# Patient Record
Sex: Male | Born: 1970 | Race: White | Hispanic: No | Marital: Married | Smoking: Never smoker
Health system: Southern US, Community
[De-identification: ages and names within clinical notes are randomized; demographics above are authoritative.]

## PROBLEM LIST (undated history)

## (undated) DIAGNOSIS — G709 Myoneural disorder, unspecified: Secondary | ICD-10-CM

## (undated) DIAGNOSIS — M1990 Unspecified osteoarthritis, unspecified site: Secondary | ICD-10-CM

## (undated) HISTORY — PX: SPINE SURGERY: SHX786

## (undated) HISTORY — PX: JOINT REPLACEMENT: SHX530

## (undated) HISTORY — DX: Myoneural disorder, unspecified: G70.9

## (undated) HISTORY — DX: Unspecified osteoarthritis, unspecified site: M19.90

---

## 2001-09-13 ENCOUNTER — Encounter: Admission: RE | Admit: 2001-09-13 | Discharge: 2001-09-13 | Payer: Self-pay | Admitting: *Deleted

## 2001-12-25 ENCOUNTER — Encounter: Payer: Self-pay | Admitting: Neurosurgery

## 2001-12-29 ENCOUNTER — Inpatient Hospital Stay (HOSPITAL_COMMUNITY): Admission: RE | Admit: 2001-12-29 | Discharge: 2001-12-30 | Payer: Self-pay | Admitting: Neurosurgery

## 2001-12-29 ENCOUNTER — Encounter: Payer: Self-pay | Admitting: Neurosurgery

## 2002-07-05 ENCOUNTER — Encounter: Admission: RE | Admit: 2002-07-05 | Discharge: 2002-07-05 | Payer: Self-pay | Admitting: Neurosurgery

## 2002-07-05 ENCOUNTER — Encounter: Payer: Self-pay | Admitting: Neurosurgery

## 2002-07-23 ENCOUNTER — Encounter: Payer: Self-pay | Admitting: Emergency Medicine

## 2002-07-23 ENCOUNTER — Emergency Department (HOSPITAL_COMMUNITY): Admission: EM | Admit: 2002-07-23 | Discharge: 2002-07-24 | Payer: Self-pay | Admitting: Emergency Medicine

## 2002-07-25 ENCOUNTER — Emergency Department (HOSPITAL_COMMUNITY): Admission: EM | Admit: 2002-07-25 | Discharge: 2002-07-25 | Payer: Self-pay | Admitting: Emergency Medicine

## 2002-08-02 ENCOUNTER — Other Ambulatory Visit: Admission: RE | Admit: 2002-08-02 | Discharge: 2002-08-02 | Payer: Self-pay | Admitting: *Deleted

## 2002-08-13 ENCOUNTER — Ambulatory Visit (HOSPITAL_COMMUNITY): Admission: RE | Admit: 2002-08-13 | Discharge: 2002-08-13 | Payer: Self-pay | Admitting: *Deleted

## 2002-08-18 ENCOUNTER — Ambulatory Visit (HOSPITAL_COMMUNITY): Admission: RE | Admit: 2002-08-18 | Discharge: 2002-08-18 | Payer: Self-pay | Admitting: *Deleted

## 2002-08-18 ENCOUNTER — Encounter (INDEPENDENT_AMBULATORY_CARE_PROVIDER_SITE_OTHER): Payer: Self-pay | Admitting: Specialist

## 2002-08-24 ENCOUNTER — Ambulatory Visit (HOSPITAL_COMMUNITY): Admission: RE | Admit: 2002-08-24 | Discharge: 2002-08-24 | Payer: Self-pay | Admitting: *Deleted

## 2002-10-20 ENCOUNTER — Encounter: Admission: RE | Admit: 2002-10-20 | Discharge: 2002-10-20 | Payer: Self-pay | Admitting: *Deleted

## 2002-10-20 ENCOUNTER — Encounter: Payer: Self-pay | Admitting: *Deleted

## 2003-08-25 ENCOUNTER — Encounter: Payer: Self-pay | Admitting: Internal Medicine

## 2003-11-01 ENCOUNTER — Encounter
Admission: RE | Admit: 2003-11-01 | Discharge: 2004-01-30 | Payer: Self-pay | Admitting: Physical Medicine & Rehabilitation

## 2003-12-06 ENCOUNTER — Encounter
Admission: RE | Admit: 2003-12-06 | Discharge: 2004-03-05 | Payer: Self-pay | Admitting: Physical Medicine & Rehabilitation

## 2004-04-19 ENCOUNTER — Encounter
Admission: RE | Admit: 2004-04-19 | Discharge: 2004-06-28 | Payer: Self-pay | Admitting: Physical Medicine & Rehabilitation

## 2004-04-24 ENCOUNTER — Ambulatory Visit: Payer: Self-pay | Admitting: Physical Medicine & Rehabilitation

## 2004-06-28 ENCOUNTER — Encounter
Admission: RE | Admit: 2004-06-28 | Discharge: 2004-08-24 | Payer: Self-pay | Admitting: Physical Medicine & Rehabilitation

## 2004-07-03 ENCOUNTER — Ambulatory Visit: Payer: Self-pay | Admitting: Physical Medicine & Rehabilitation

## 2004-08-24 ENCOUNTER — Encounter
Admission: RE | Admit: 2004-08-24 | Discharge: 2004-11-22 | Payer: Self-pay | Admitting: Physical Medicine & Rehabilitation

## 2004-08-28 ENCOUNTER — Ambulatory Visit: Payer: Self-pay | Admitting: Physical Medicine & Rehabilitation

## 2004-11-13 ENCOUNTER — Ambulatory Visit: Payer: Self-pay | Admitting: Physical Medicine & Rehabilitation

## 2005-02-06 ENCOUNTER — Encounter
Admission: RE | Admit: 2005-02-06 | Discharge: 2005-05-07 | Payer: Self-pay | Admitting: Physical Medicine & Rehabilitation

## 2005-02-06 ENCOUNTER — Ambulatory Visit: Payer: Self-pay | Admitting: Physical Medicine & Rehabilitation

## 2005-04-04 ENCOUNTER — Ambulatory Visit: Payer: Self-pay | Admitting: Physical Medicine & Rehabilitation

## 2005-05-27 ENCOUNTER — Encounter
Admission: RE | Admit: 2005-05-27 | Discharge: 2005-08-25 | Payer: Self-pay | Admitting: Physical Medicine & Rehabilitation

## 2005-05-28 ENCOUNTER — Ambulatory Visit: Payer: Self-pay | Admitting: Physical Medicine & Rehabilitation

## 2005-06-18 ENCOUNTER — Ambulatory Visit: Payer: Self-pay | Admitting: Physical Medicine & Rehabilitation

## 2005-08-08 ENCOUNTER — Ambulatory Visit: Payer: Self-pay | Admitting: Physical Medicine & Rehabilitation

## 2005-08-28 ENCOUNTER — Ambulatory Visit: Payer: Self-pay | Admitting: Physical Medicine & Rehabilitation

## 2005-08-28 ENCOUNTER — Encounter
Admission: RE | Admit: 2005-08-28 | Discharge: 2005-11-26 | Payer: Self-pay | Admitting: Physical Medicine & Rehabilitation

## 2005-10-17 ENCOUNTER — Ambulatory Visit: Payer: Self-pay | Admitting: Physical Medicine & Rehabilitation

## 2005-12-30 ENCOUNTER — Encounter: Admission: RE | Admit: 2005-12-30 | Discharge: 2006-01-17 | Payer: Self-pay | Admitting: Family Medicine

## 2006-01-01 ENCOUNTER — Emergency Department (HOSPITAL_COMMUNITY): Admission: EM | Admit: 2006-01-01 | Discharge: 2006-01-02 | Payer: Self-pay | Admitting: Emergency Medicine

## 2006-02-26 ENCOUNTER — Encounter: Admission: RE | Admit: 2006-02-26 | Discharge: 2006-03-18 | Payer: Self-pay | Admitting: Orthopedic Surgery

## 2006-03-19 ENCOUNTER — Encounter: Admission: RE | Admit: 2006-03-19 | Discharge: 2006-04-03 | Payer: Self-pay | Admitting: Orthopedic Surgery

## 2006-03-25 ENCOUNTER — Encounter: Admission: RE | Admit: 2006-03-25 | Discharge: 2006-04-03 | Payer: Self-pay | Admitting: Orthopedic Surgery

## 2006-04-08 ENCOUNTER — Ambulatory Visit (HOSPITAL_COMMUNITY): Admission: RE | Admit: 2006-04-08 | Discharge: 2006-04-08 | Payer: Self-pay | Admitting: Orthopedic Surgery

## 2006-04-14 ENCOUNTER — Ambulatory Visit (HOSPITAL_COMMUNITY): Admission: RE | Admit: 2006-04-14 | Discharge: 2006-04-16 | Payer: Self-pay | Admitting: Orthopedic Surgery

## 2006-05-07 ENCOUNTER — Encounter: Admission: RE | Admit: 2006-05-07 | Discharge: 2006-06-30 | Payer: Self-pay | Admitting: Orthopedic Surgery

## 2006-07-02 ENCOUNTER — Encounter: Admission: RE | Admit: 2006-07-02 | Discharge: 2006-09-23 | Payer: Self-pay | Admitting: Family Medicine

## 2007-12-21 IMAGING — CR DG CHEST 2V
2 series · 2 of 2 positions shown · non-contrast
Comparison: Report of prior chest x-ray 07/23/02.

CLINICAL DATA: Pre-op for patellar instability.  
 CHEST ? 2 VIEW:

[view not recorded (1 of 2)]
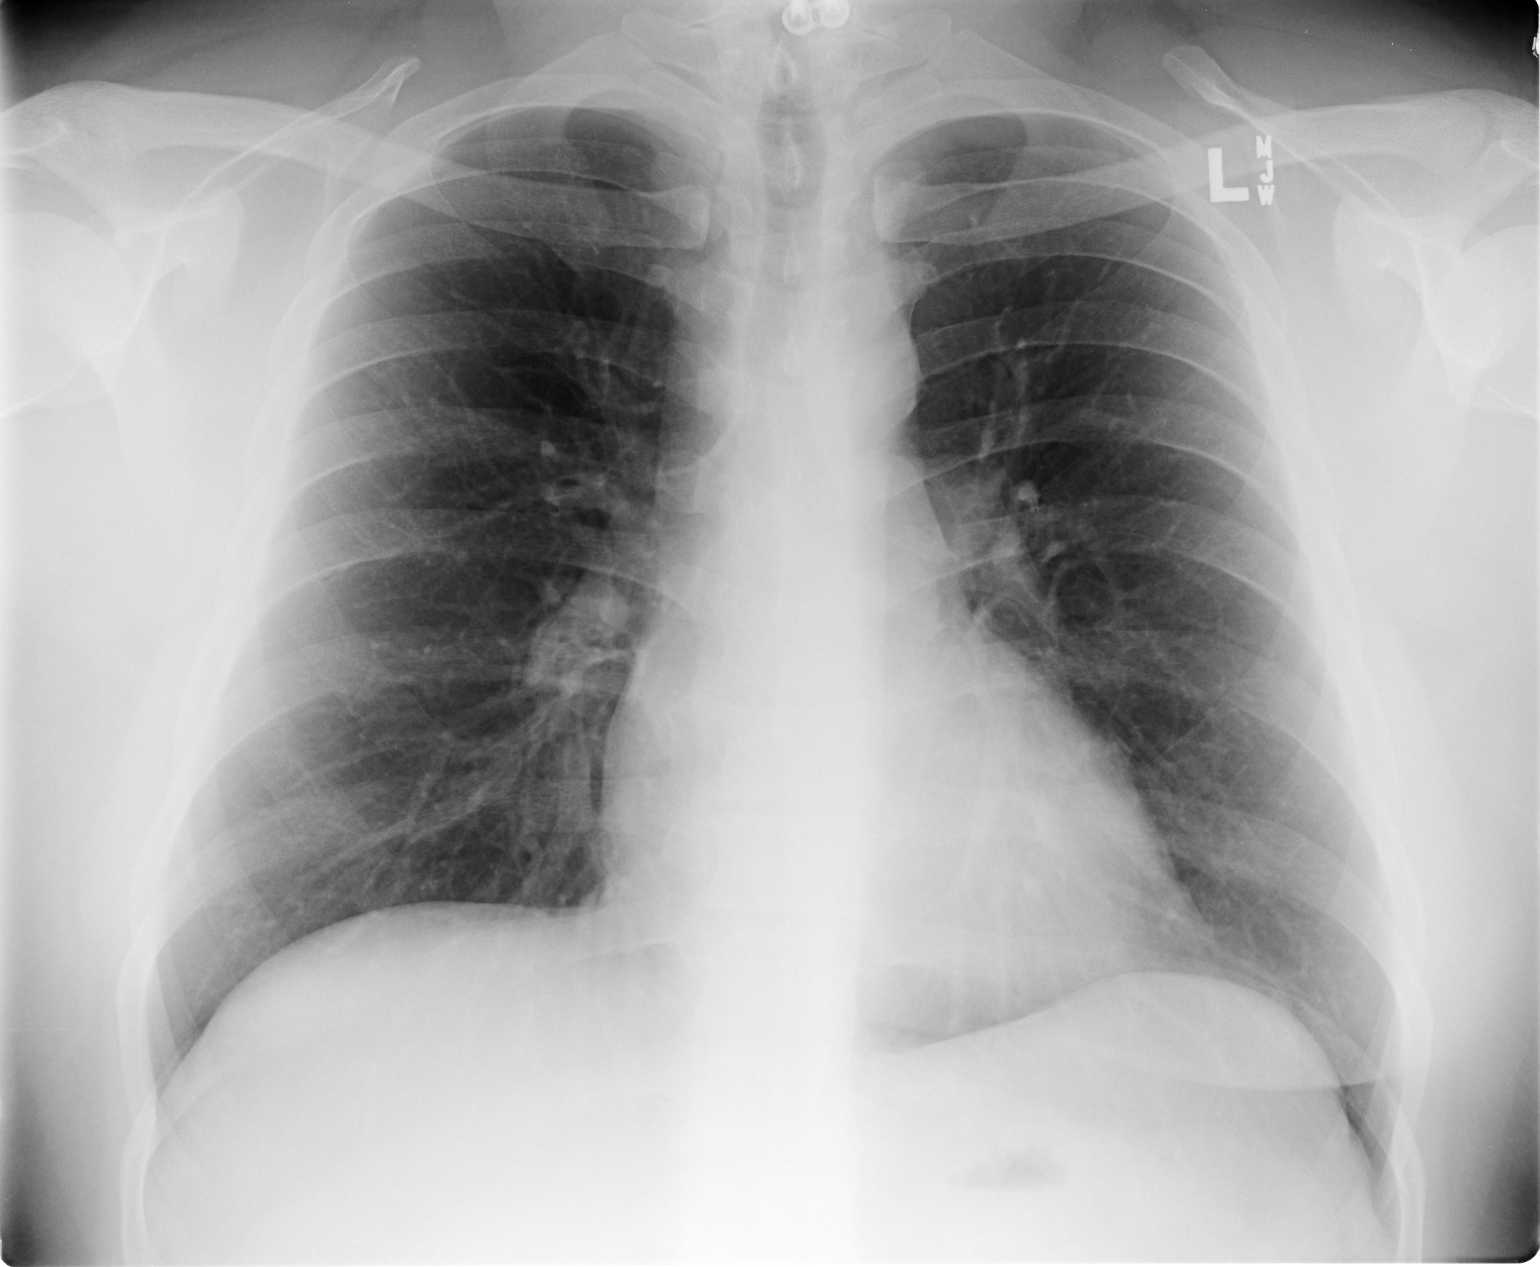

[view not recorded (2 of 2)]
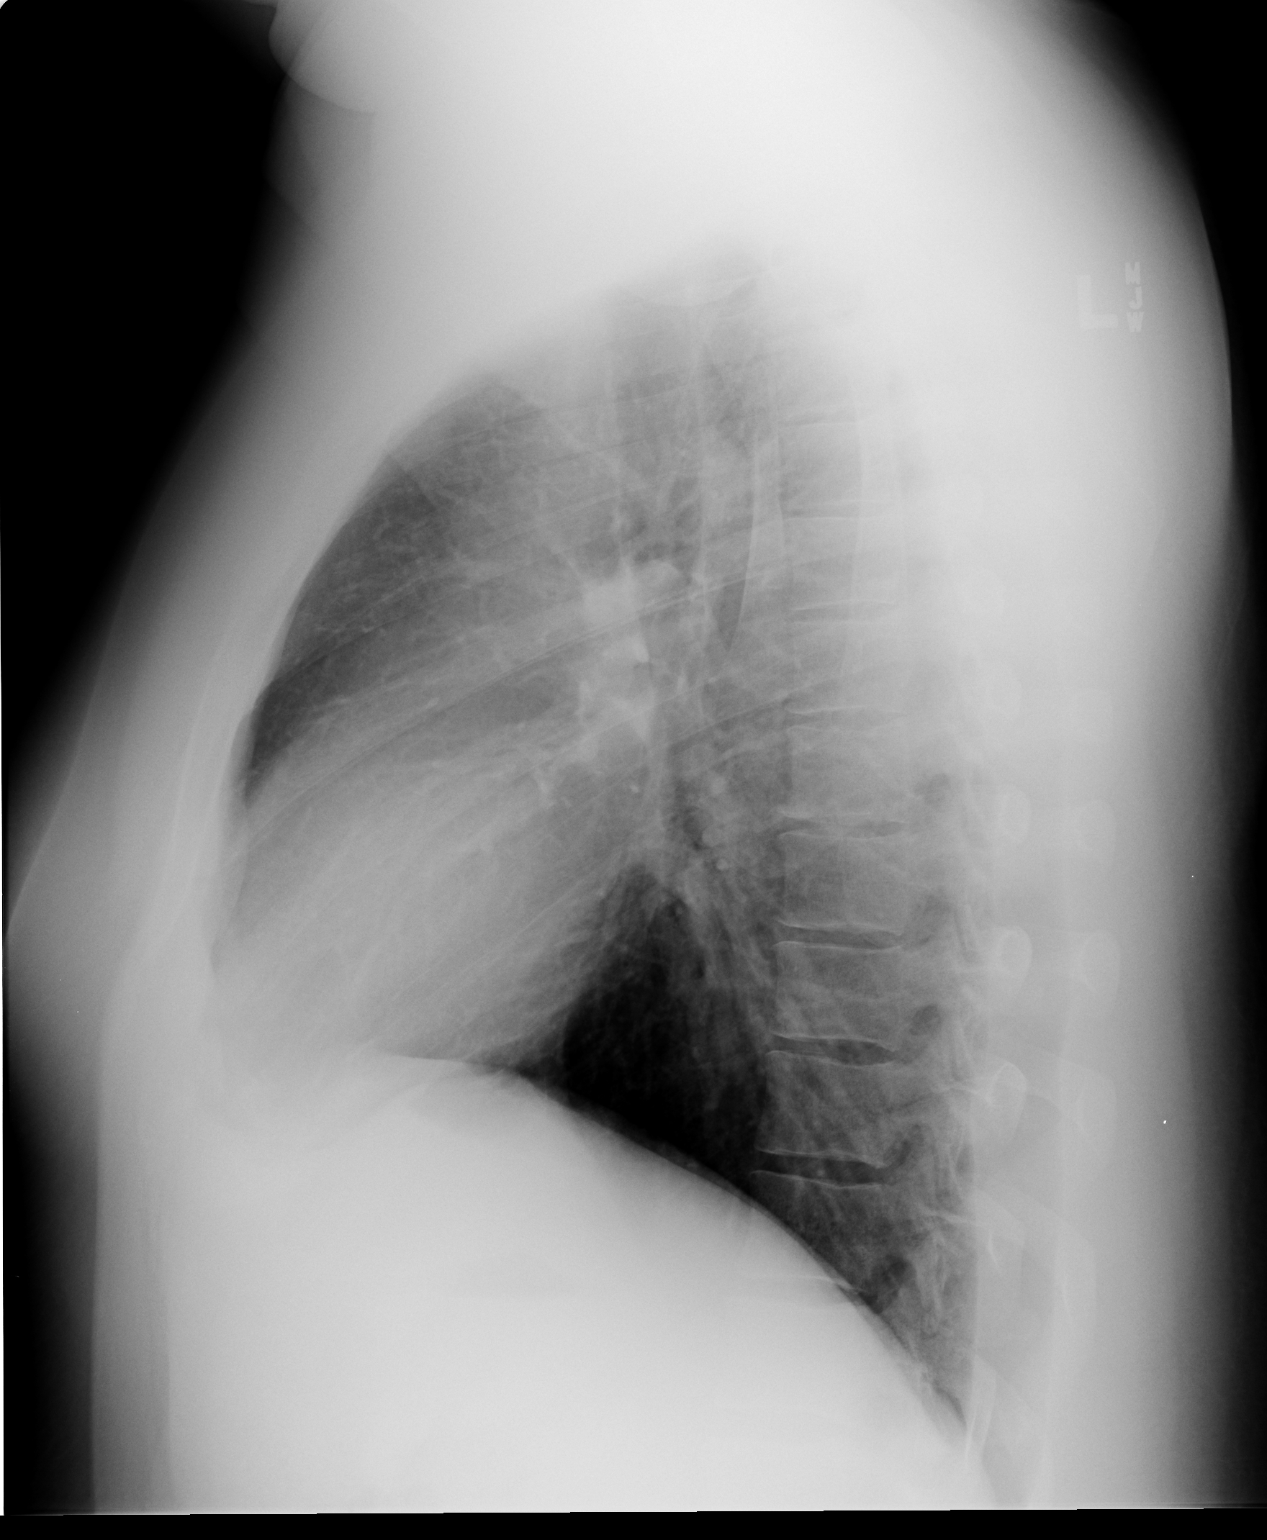

[2 of 2 positions shown; findings below may reference images not displayed]

FINDINGS: Heart is normal.  Lungs are clear.  There is mild peribronchial thickening and the lungs are perhaps mildly hyperaerated on the lateral view.
IMPRESSION: There are some chronic changes but no acute abnormality is noted.

## 2008-02-09 ENCOUNTER — Emergency Department (HOSPITAL_COMMUNITY): Admission: EM | Admit: 2008-02-09 | Discharge: 2008-02-09 | Payer: Self-pay | Admitting: Emergency Medicine

## 2011-01-04 NOTE — Assessment & Plan Note (Signed)
MEDICAL RECORD NUMBER:  [id number].   HISTORY:  A 40 year old male with chronic knee pain, multiple knee surgeries  for ACL rupture. Has had right L4 radiculopathy as well. Last knee  aspiration was done about three Luevano ago. Last right L4 transforaminal  epidural steroid injection was done September 27, 2004. He has done really  quite well. He has been much more active since we started the MS immediate  release for breakthrough pain, and he is only taking about one and half 15-  mg tablets a day in addition to his MS Contin 30 mg q.12h. He has also done  well with the knee hypersensitivity while on the Lyrica 150 p.o. b.i.d., and  his wife can now rub his leg without discomfort. 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 His average pain is 6/10. Pain interference with general activity  he says is 10, relationship with other people 6, and enjoyment of life 4. He  climb steps and drive.   REVIEW OF SYSTEMS:  Positive for diarrhea and constipation but no changes.  He has some weakness in the right knee. He did have about 110 cc of synovial  fluid drained out off his knee about three Mckenna ago per Dr. Madelon Lips.   PHYSICAL EXAMINATION:  Blood pressure 153/104. He has been instructed to  follow up with his primary care physician.   His knee shows a mild to moderate effusion on the right side only. No scar  hypersensitivity on the medial arthrotomy scar. His strength in the lowers  is 5- right quadriceps, 5 left quadriceps, 5 bilateral ankle dorsi flexors  and toe extensors. His sensation is normal in bilateral feet, mildly reduced  along the scar on the right side of his knee.   Deep tendon reflexes are normal.   IMPRESSION:  1.  Right L4 radiculitis.  2.  Scar dysesthesias, improved.  3.  Right knee pain with history of cartilage disruption and  traumatic      arthropathy.   I will see him back in about two to three months. Continue Lyrica 150  b.i.d., MS Contin 30 mg q.12h, and MSIR 15 mg 1-1/2 tablets per day.       AEK/MedQ  D:  02/07/2005 16:28:50  T:  02/08/2005 07:55:56  Job #:  [id number]   cc:   Dyke Brackett, M.D.  Fax: [phone]

## 2011-01-04 NOTE — Assessment & Plan Note (Signed)
MEDICAL RECORD NUMBER:  [id number]   DATE OF BIRTH:  08-04-1971   HISTORY OF PRESENT ILLNESS:  Arthur Alvarez is a 40 year old male with chronic  knee pain, multiple knee surgeries for ACL rupture.  He also has right L4  radiculopathy.  He had his last knee aspiration October 02, 2004, and last  right L4 transforaminal steroid injection on September 27, 2004.  He has done  reasonably well, although, he does have days where he has breakthrough pain  in his knee area.  He thinks that his knee hypersensitivity has improved  since starting on Lyrica.  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  His son is a pitcher, so he does a lot  of catching, and this has been aggravating his knee somewhat.   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   SOCIAL HISTORY:  Married, dips snuff, no other tobacco or alcohol use.  He  states he keeps his medication in a secure location, has a 66-year-old child.   MEDICATIONS:  His nonprescription drug use is 4-6 ibuprofen a day when he  needs it.  He is no longer taking Celebrex.   PHYSICAL EXAMINATION:  VITAL SIGNS:  Blood pressure 144/79, pulse 78,  respirations 16, O2 saturation 96% on room air.  GENERAL APPEARANCE:  No acute distress, affect appropriate.  NEUROLOGICAL:  His knee has no evidence of effusion.  He has some  hypersensitivity to touch with his medial scar.  He has no swelling in the  lower extremities.  He has minor tenderness to palpation in the lumbar  paraspinals, particularly on the right side.  He has about 50% forward  flexion, 50% extension, lateral rotation.   IMPRESSION:  1.  Chronic right L4 radiculopathy.  2.  Right knee pain.  Patient with posttraumatic osteoarthritis, as well as,      scar hypersensitivity.   PLAN:  1.  Will continue MS Contin 30 mg p.o. q.12h.  2.  Will initiate MSIR for breakthrough 15 mg p.o. daily.  3.  Continue Lyrica 150 mg p.o. b.i.d.  4.  I  will see him back in two months.  May need another lumbar epidural      steroid injection at that time, but we will reassess prior to      scheduling.      AEK/MedQ  D:  11/13/2004 12:28:26  T:  11/13/2004 13:13:49  Job #:  [id number]

## 2011-01-04 NOTE — Procedures (Signed)
NAME:  Arthur Alvarez, Arthur Alvarez                        ACCOUNT NO.:  [id number]   MEDICAL RECORD NO.:  [id number]                   PATIENT TYPE:   LOCATION:                                       FACILITY:  [facility]   PHYSICIAN:  Erick Colace, M.D.           DATE OF BIRTH:  [birth year]   DATE OF PROCEDURE:  12/05/2003  DATE OF DISCHARGE:                                 OPERATIVE REPORT   PROCEDURE:  Right L4 trans-foraminal epidural steroid injection.   PHYSICIAN:  Erick Colace, M.D.   INDICATIONS FOR PROCEDURE:  This 40 year old male with right lower extremity  pain.  He had a football injury in 1986, with an ACL rupture, but also  moderate canal narrowing at L4-5 and spondylolisthesis at that level.   INFORMED CONSENT:  An informed consent was obtained after describing the  risks and benefits of the procedure to the patient.  These risks include  bleeding, bruising, infection including paralysis of the lower extremities,  loss of bowel or bladder function, either temporary or permanent.  The  patient elected to proceed.   DESCRIPTION OF PROCEDURE:  The patient was placed in the prone position on  the fluoroscopy table.  A Betadine prep.  A sterile drape.  A #22 gauge five  inch spinal needle was inserted after anesthetizing the skin and  subcutaneous tissues with 1% lidocaine x4 mL.  Under fluoroscopic guidance,  the needle was manipulated in the subcuticular area of L4, not exceeding 6  o'clock on the AP view.  Initial injection of Omnipaque 180 under live  fluoroscopy showed no evidence of epidural or perineural spread.  Needle  repositioning, followed by an additional injection of 0.5 mL of Omnipaque  180 demonstrated epidural spread as well as outlining of the nerve root.  Then a solution containing 1 mL of 40 mg per mL Kenalog and 2 mL of 1%  lidocaine, methylparaben-free were injected.   DISPOSITION:  The patient tolerated the procedure well post-injection, and  instructions were given.  He is to return in three Riggan to access efficacy.  If partial efficacy, re-inject at the same level.  If no efficacy at all,  consider the L5 level.  If complete relief, then will hold off on  reinjection.                                                Erick Colace, M.D.    AEK/MEDQ  D:  12/05/2003 12:01:32  T:  12/05/2003 15:57:30  Job:  [id number]   cc:   Thera Flake., M.D.  [address] [place]  [place] [address]  Fax: [phone]   Gladstone Pih, Ph.D.  [address] [place]  [place] [address]

## 2011-01-04 NOTE — Assessment & Plan Note (Signed)
HISTORY OF PRESENT ILLNESS:  [name] returns after I last saw him on  July 31, 2004.  At that time, he stopped the Neurontin and started on  some Lyrica.   He is doing fairly well, in general.  He is exercising more including some  walking and even some treadmill.  Trying to loose weight.  Pain level about  7/10 was wondering whether it is the increased activity or the injection  wearing off is resulting in some increasing right lower extremity pain.  The  pain improves with rest, ice, therapy, medication.  Worse with walking,  bending, sitting working.   SOCIAL HISTORY:  Dips snuff, married, works full time.   REVIEW OF SYSTEMS:  Positive for poor sleep, weakness, numbness in the right  lower extremity.  Otherwise, few review of systems.   PHYSICAL EXAMINATION:  VITAL SIGNS:  Blood pressure 121/76, pulse 92,  respirations 20, O2 98% on room air.  EXTREMITIES:  Knees showed no evidence of fusion.  Ankles, no problems with  contracture.  He has good hip and knee range of motion.  Back range of  motion poor.  Flexion of 45 degrees, extension about 50% of normal.  He is  able to walk heel walk.  He has good deep tendon reflexes bilaterally.   IMPRESSION:  1.  Right L4 radiculopathy, having some recurrence.  Last injection      approximately three months ago.  2.  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   PLAN:  1.  Will continue Lyrica.  I think this is working out for him, 150 mg      b.i.d.  2.  MS Contin 30 mg p.o. b.i.d.  3.  Set him up for another right L4 transforaminal epidural steroid      injection in a month or so.  4.  Once he finishes the injection and he remains stable on medication,      would consider transitioning him to [facility]      here at [facility].      Andr   AEK/MedQ  D:  08/28/2004 13:39:46  T:  08/28/2004 14:09:50  Job #:  [id number]

## 2011-01-04 NOTE — Procedures (Signed)
NAMEWILEY, MAGAN              ACCOUNT NO.:  [id number]   MEDICAL RECORD NO.:  [id number]          PATIENT TYPE:  REC   LOCATION:  [facility]                          FACILITY:  [facility]   PHYSICIAN:  Erick Colace, M.D.DATE OF BIRTH:  1970-12-28   DATE OF PROCEDURE:  09/27/2004  DATE OF DISCHARGE:                                 OPERATIVE REPORT   Date of birth:  [birth year].   Informed consent was obtained after describing risks and benefits of the  procedure with the patient.  These included bleeding, bruising, infection,  loss of bowel or bladder function, low blood pressure, headache.  He elected  to proceed.   Patient placed prone on fluoroscopy table, Betadine prep and sterile drape.  A 25-gauge 1-1/4 inch needle was used to anesthetize the skin and subcu  tissues with 1% lidocaine x2 mL.  Then a 22-gauge 5-inch spinal needle with  stylet was inserted under fluoroscopic guidance into the right L4-5 foramen  superior aspect.  The proper placement was confirmed on oblique, lateral and  AP imaging.  Omnipaque 180 initially showed no good delineation of the nerve  roots. The needle was adjusted slightly and once again Omnipaque then showed  a good outline of the nerve root.  Then a solution containing 1 mL of 40  mg/mL Kenalog plus 2 mL of 1% methylparaben-free lidocaine 1% were injected.  The patient tolerated the procedure well.  Post-injection instructions  given.  The injection was done with IV tubing so as not to disturb the  needle from the last resting point that was imaged.   The patient will follow up for a right knee injection.  He has a right knee  effusion and will get proper insurance approval for this prior to injection.  He has been doing more treadmill work as well as knee exercise.      AEK/MEDQ  D:  09/27/2004 12:57:42  T:  09/27/2004 13:34:37  Job:  [id number]

## 2011-01-04 NOTE — Procedures (Signed)
[name], Arthur Alvarez              ACCOUNT NO.:  [id number]   MEDICAL RECORD NO.:  [id number]          PATIENT TYPE:  REC   LOCATION:  [facility]                          FACILITY:  [facility]   PHYSICIAN:  Erick Colace, M.D.DATE OF BIRTH:  02-Apr-1971   DATE OF PROCEDURE:  05/31/2004  DATE OF DISCHARGE:                                 OPERATIVE REPORT   PROCEDURE:  Right L4 translaminar epidural steroid injection.   Informed consent was obtained after describing risks and benefits of the  procedure with the patient. He wishes to proceed. The patient placed prone  on the fluoroscopy table, Betadine prep, sterile drape. An 1-1/4-inch needle  was used to anesthetize the skin and subcu tissues using 1% lidocaine x3 cc.  Then a 22-gauge 5-inch spinal needle with stylet was inserted under  fluoroscopic guidance to the target underneath the right L4 pedicle. Then,  once target was reached and confirmed under AP and lateral imaging, not  exceeding 6 o'clock position on pedicle, a 0.3 cc bolus of Omnipaque 180  showed good outline of the nerve root as well as axial spread along the  epidural space. Then a solution containing 1 cc of 40 mg/cc Kenalog and 2 cc  of methylparaben-free lidocaine were injected. The patient tolerated the  procedure well. Post injection instructions given. The patient is to follow  up in 4 Baskette.       AEK/MEDQ  D:  05/31/2004 13:48:31  T:  06/01/2004 07:54:31  Job:  [id number]

## 2011-01-04 NOTE — Procedures (Signed)
NAMEMONTGOMERY, FAVOR              ACCOUNT NO.:  [id number]   MEDICAL RECORD NO.:  [id number]          PATIENT TYPE:  REC   LOCATION:  [facility]                          FACILITY:  [facility]   PHYSICIAN:  Erick Colace, M.D.DATE OF BIRTH:  06-08-1971   DATE OF PROCEDURE:  08/29/2005  DATE OF DISCHARGE:                                 OPERATIVE REPORT   MEDICAL RECORD NUMBER:  [id number].   HISTORY:  A 39 year old male last seen July 18, 2005. He has had right  L4 transforaminal epidural steroid injection last done May 31, 2004,  increasing right lower extremity pain.   In addition, he did run out early on his morphine sulfate, and for this  reason had him come in for a pill count and urinary drug screen indicating  cocaine positive, and he states this is an isolated event. However, we  reiterated our clinic policy of not prescribing narcotic analgesics for  patient's using illicit drugs, and a referral has been made to Dr. Betti Cruz  from psychiatry and addiction. If Dr. Betti Cruz is comfortable signing off that  patient has been adequately evaluated/treated for addiction, then we may  resume with monthly and p.r.n. urine drug screens with continued narcotic  analgesic prescription.   PROCEDURE:  Procedure today is right L4 transforaminal epidural steroid  injection under fluoroscopic guidance.   INDICATIONS:  Radicular pain, right L4, causing pain in the right L4  dermatome. He has spondylolisthesis, L4 and 5. Previous good effect from  previous L4 transforaminal epidural steroid injection.   He is not on any anticoagulants.   Informed consent was obtained after describing risks and benefits of the  procedure to the patient. These include bleeding, bruising, infection, loss  of bowel and bladder function, temporary or permanent paralysis, and he  elects to proceed and has given written consent. The patient placed prone on  fluoroscopy table, Betadine prep, sterile drape. A 25-gauge  1-1/2-inch  needle was used to anesthetize skin and subcu tissues. Then a 5-inch 22-  gauge needle was inserted under fluoroscopic guidance in the right L4-5  intervertebral foramen using multiple AP and lateral oblique imaging. Once  proper needle location was ascertained, Omnipaque 180 x0.5 cc showed both  epidural spread as well as spread down the nerve root. Then a solution  containing 1 cc of 40 mg/cc of Depo-Medrol and 2 cc of 1%  methylparaben-free lidocaine were injected. The patient tolerated the  procedure well. Pre/post injection vitals stable. Repeat injections in one  month. Have prescribed Ultram in the interval time, 2 tablets t.i.d. p.r.n.  pain.      Erick Colace, M.D.  Electronically Signed     AEK/MEDQ  D:  08/29/2005 16:32:58  T:  08/30/2005 11:04:44  Job:  [id number]   cc:   Daine Floras, M.D.  Fax: [phone]

## 2011-01-04 NOTE — Procedures (Signed)
Arthur Alvarez, HOBAN              ACCOUNT NO.:  [id number]   MEDICAL RECORD NO.:  [id number]          PATIENT TYPE:  REC   LOCATION:  [facility]                          FACILITY:  [facility]   PHYSICIAN:  Erick Colace, M.D.DATE OF BIRTH:  [birth year]   DATE OF PROCEDURE:  09/23/2005  DATE OF DISCHARGE:                                 OPERATIVE REPORT   A 40 year old male with L4 radiculopathy, right chronic knee pain, with  history of anterior cruciate ligament rupture, multiple surgical procedures.  He has had positive UDS for cocaine following a period of escalation of his  narcotic analgesics.  He is currently undergoing a substance abuse treatment  program as an outpatient with Dr. Betti Cruz.  Per patient's report, he is  graduating tomorrow.  Per my calculation, he should be done February 8.   The patient states he has pain in his right knee greater than his low back,  greater than his neck.  He did not get much relief from the right L4  transforaminal epidural steroid injection under fluoroscopic guidance  performed on August 29, 2005.  He does admit to some improvement in his  back pain.   CURRENT MEDICATIONS:  Ultram 50 mg two p.o. t.i.d., which helps somewhat but  not as well as the morphine.   He is working full-time.  His pain level is 9/10 in relationship to his  knee, 8 in the low back and neck.   He drives and climbs steps.  He works as a Designer, industrial/product.  Has some  anxiety, numbness, tingling, weakness, diarrhea, constipation and weight  loss.  He has injured his left ankle in the interval time and has seen Dr.  Madelon Lips for this.   IMPRESSION:  1.  Chronic right knee pain with osteoarthritis, eventually to undergo      surgery.  2.  Right L4 radiculopathy.  3.  Cervicalgia.  4.  History of cocaine abuse.   PLAN:  1.  Will continue Ultram and increase to two tablets four times a day.  2.  Will check UDS today.  If negative, would be able to restart morphine  once he has a test after completion of his substance abuse treatment      program.   ADDENDUM:  Right knee injection.   Lateral approach, Betadine prep.  A 22-gauge, 1-1/2 inch needle inserted.  No fluid aspirated.  Injected 1 mL of 40 mg/mL of Kenalog plus 4 mL of 1%  lidocaine.  The patient tolerated the procedure well.      Erick Colace, M.D.  Electronically Signed     AEK/MEDQ  D:  09/23/2005 17:18:56  T:  09/24/2005 07:57:33  Job:  [id number]   cc:   Daine Floras, M.D.  Fax: [phone]   Elsie Stain, M.D.  [facility]  Valinda Hoar [phone]   Dyke Brackett, M.D.  Fax: [phone]   Titus Dubin. Alwyn Ren, M.D. [facility]  [phone] [place]  [place] [address]

## 2011-01-04 NOTE — Assessment & Plan Note (Signed)
INTERVAL HISTORY:  A 40 year old male with L4 radiculopathy, chronic right  knee pain with history of anterior cruciate ligament rupture, multiple right  knee arthroscopies.  He was last seen by me September 23, 2005.  He had a UDS  positive for cocaine, had a period of escalation of narcotic analgesics, he  went through substance abuse program as an outpatient.  He has missed  recently some outpatient group meetings due to illness in his extended  family.  He did not get much relief from the right L4 transforaminal and  epidural steroid injections under fluoroscopic guidance performed August 29, 2005, previously he has had some good results with this.  He had about 2  Hoecker of improvement after right knee injection with corticosteroid, has not  had corticosteroid injection for quite some time, i.e., over a year per his  report.   He feels like his pain has been overall under better control since  restarting the morphine.  He is on MS Contin 30 mg twice daily and MSIR 15  mg daily.  He however ran out of his MSIR early because he thought he had 45  tablets as was prescribed previously.  He is also going to be about 4 days  short of MS Contin.  He rates his average pain at 7-8/10.  He has been able  to work as Designer, industrial/product.  He needs assistance with bathing.  He has  anxiety and some diarrhea and constipation alternating.   EXAMINATION:  Blood pressure 120/81, pulse 86, respiratory rate 16, O2  saturation 97% room air.   In general, no acute distress.  Mood and affect appropriate.  Mildly  anxious.  He has no evidence of effusion, no erythema.  He has full  extension and flexion is to about 100 degrees.  He had some tenderness to  palpation bilateral lumbar paraspinals.  His forward flexion is 50% and  extension 50%.  He has 1+ knee jerk on the right, 2+ on the left, 2+  bilateral ankles, full strength in bilateral lower extremities and knee  extensor, hip flexor and ankle  dorsiflexor.   Gait is without abnormalities.   IMPRESSION:  1.  Chronic right L4 radiculopathy.  2.  Right knee pain, chronic, probable early osteoarthritis.   PLAN:  1.  Continue morphine sulfate 30 mg twice daily, may need to switch to      Avinza 60 mg daily in order to improve compliance.  2.  Stay with morphine IR at one tablet per day p.r.n. breakthrough.      Reiterate need to stick to prescribed amounts and to admit that he will      not get any early refills.  He will have to go without.  3.  Repeat urine drug screen today.  If comes back positive for illicit      drugs or opiates that are not prescribed through this office I will      discharge from our clinic.  4.  Encouraged him to see Dr. Madelon Lips in regards to his knee.  5.  We will give a trial of Limbrel that is i.e., flavocoxid, 500 p.o. twice      a day.      Erick Colace, M.D.  Electronically Signed     AEK/MedQ  D:  10/18/2005 13:58:59  T:  10/18/2005 17:25:24  Job #:  [id number]   cc:   Dr. Bonnye Fava F. Alwyn Ren, M.D. [facility]  [phone]  [address]  [place]  [place] [address]   Dyke Brackett, M.D.  Fax: [phone]

## 2011-01-04 NOTE — Op Note (Signed)
[place]  Patient:    Arthur Alvarez, Arthur Alvarez Visit Number: [id number] MRN: [id number]          Service Type: SUR Location: 3000 3020 01 Attending Physician:  Emeterio Reeve Dictated by:   Payton Doughty, M.D. Proc. Date: 12/29/01 Admit Date:  12/29/2001 Discharge Date: 12/30/2001                             Operative Report  PREOPERATIVE DIAGNOSIS:  Spondylolisthesis C4-C5 and C5-C6.  POSTOPERATIVE DIAGNOSES:  Spondylolisthesis C4-C5, herniated disk C5-C6.  PROCEDURE:   Anterior cervical diskectomy and fusion with a tether plate at V7-Q4 and C5-C6.  SURGEON:  Payton Doughty, M.D.  ANESTHESIA:  General endotracheal.  PREPARATION:  The skin was prepped with an alcohol wipe.  COMPLICATIONS:  None.  BODY OF TEXT:  The patient is a 40 year old right-handed white gentleman with severe spondylolisthesis of C4-C5 and C5-C6.  He was taken to the operating room, anesthetized, and intubated, placed supine on the operating table in the halter head traction.  Following shave, prep, and drape in the usual sterile fashion, skin was incised in the midline, at the medial border of the sternocleidomastoid muscle on the left side.  The platysma was identified, elevated, divided, and undermined.  Sternocleidomastoid was then identified, and medial dissection revealed the carotid artery tracked laterally to the left.  Trachea and esophagus tracked laterally to the right, exposing the bone of the anterior cervical spine.  Carefully retracting the esophagus, a marker was placed.  Intraoperative x-ray was obtained and confirmed correctness of the level.  The longus colli were taken down bilaterally and the [facility] retractor placed.  Diskectomy was carried out at C4-C5 and C5-C6 under gross observation.  The operating microscope was then brought in, and microdissection technique used to remove the remaining disk, dissecting the anterior epidural space and  decompressing the nerve roots bilaterally.  At C4-C5, there was a large osteophyte off to the left side with significant compression of the left C5 root.  This was removed with the Kerrison punch. At C5-C6, most of the pathology was off to the right with a true fragment of disk actually having gone through the posterior longitudinal ligament sitting over the right C5-C6 root.  This was removed without difficulty.  All neural foramina were explored and found to be open.  Then 7-mm bone grafts were fashioned from patellar allograft and tapped into place.  A 38-mm #2 level tether place  was then placed with 13-mm screws, two in C4, one in C5, and two in C6.  Intraoperative x-ray showed good placement of bone grafts, plate, and screws.  The wound was irrigated, and hemostasis assured.  The platysma was reapproximated with 3-0 Vicryl in interrupted fashion.  The subcutaneous tissue was reapproximated with 3-0 Vicryl in interrupted fashion, and the skin was closed with 4-0 Vicryl in a running subcuticular fashion.  Benzoin and Steri-Strips were placed and made occlusive with topical OpSite, and the patient was placed in an Aspen collar and returned to the recovery room in good condition. Dictated by:   Payton Doughty, M.D. Attending Physician:  Emeterio Reeve DD:  12/29/01 TD:  12/30/01 Job: [id number]

## 2011-01-04 NOTE — Assessment & Plan Note (Signed)
Arthur Alvarez returns after previous visit on July 03, 2004.  Right L4  transforaminal epidural steroid injection done on June 01, 2003 and he  was started on Neurontin and did not feel that this is really very helpful.  Pain score 6/10, go from 5 to 8.  Pain is mainly in the right knee, low  back, and then across the upper back and improved with rest, heat, ice,  medications.  Made worse with walking, bending, sitting, working.   INTERVAL HISTORY:  Negative.   SOCIAL HISTORY:  Working full-time, married.  Dips snuff.   REVIEW OF SYSTEMS:  Positive for anxiety, poor sleep, weakness, numbness of  the right lower.  See review of systems.   PHYSICAL EXAMINATION:  VITAL SIGNS:  Blood pressure 153/87, pulse 96,  respirations 16, O2 saturation 99% on room air.  SKIN:  No abscess.  EXTREMITIES:  Knee is without effusion.  Ambulates without evidence of toe  drag or knee instability.   IMPRESSION:  Lumbar pain with right lower extremity radicular symptoms which  have generally improved.  Does not seem to do as well on the Neurontin as he  would like.  We will try Lyrica 50 mg t.i.d. x1 week and then will bump him  up to 150 mg b.i.d.   I will see him back in one month.      Andr   AEK/MedQ  D:  08/28/2004 13:42:55  T:  08/28/2004 14:09:45  Job #:  [id number]

## 2011-01-04 NOTE — Op Note (Signed)
Arthur Alvarez, Arthur Alvarez              ACCOUNT NO.:  [id number]   MEDICAL RECORD NO.:  [id number]          PATIENT TYPE:  OIB   LOCATION:  5028                         FACILITY:  [facility]   PHYSICIAN:  Burnard Bunting, M.D.    DATE OF BIRTH:  [birth year]   DATE OF PROCEDURE:  04/14/2006  DATE OF DISCHARGE:  04/16/2006                               OPERATIVE REPORT   PREOPERATIVE DIAGNOSIS:  Left knee patellar instability and loose body.   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   PROCEDURES:  Left knee diagnostic arthroscopy with removal of loose body  from anterior compartment.  Chondroplasty, lateral femoral condyle.  Partial posterolateral meniscectomy, and medial patellofemoral ligament  reconstruction.   SURGEON:  Burnard Bunting, M.D.   ASSISTANT:  None.   ANESTHESIA:  General endotracheal.   ESTIMATED BLOOD LOSS:  Minimal.   INDICATIONS:  Arthur Alvarez is a 40 year old patient with left knee  patellar dislocation and intra-articular pathology who presents now for  operative management after failure of conservative management and  continued dislocation of the left knee and the left patella.   PROCEDURE IN DETAIL:  The patient was brought to the operating room  where general endotracheal anesthesia was induced.  Preoperative  antibiotics were administered.  The left leg was examined under  anesthesia and found to have full range of motion, stable collateral  fissure ligament, significant lateral patellar instability with 3+  lateral instability.  The left knee was prepped in DuraPrep solution and  draped in a sterile manner.  Arthur Alvarez was used to cover the operative  field.  The leg was elevated, exsanguinated and wrapped with the Esmarch  and tourniquet.  The tourniquet was inflated.  Anterior, inferior and  lateral portals were established.  The anterior, inferior, medial  borders then established under  direct visualization.  Diagnostic  arthroscopy was performed.  Patient was noted to have a loose body  within the anterior compartment measuring 7 x 7 mm.  It was an  osteochondral fragment from the patella.  This was removed.  Medial  compartment was intact.  The ACL and PCL were intact.  Patella  subluxated against the lateral femoral condyle.  Patient did have  chondral defect on the lateral femoral condyle which was debrided.  Loose chondral flaps were debrided back to a stable rim.  This area was  about 8 x 8 mm.  Subchondral bone was not exposed.  A lateral meniscal  tear was also present which was a radial-type tear which was more to the  posterior horn, which was debrided involving about 30%  anterior/posterior width of the meniscus.  After addressing all intra-  articular pathology, instruments were removed from the portals.   At this time an incision was made on the medial border of the patella.  Skin and subcutaneous tissue were sharply divided.  Three layers were  identified on the medial aspect of the patella.  The capsule bound by  the layer which did hold the medial patella from the ligament, and then  followed by  one retinacular layer.  The medial patellofemoral ligament  was attenuated and absent.  A second incision was made over the medial  condyle.  Using an allograft, anterior tib tendon, the isometric point  of the medial patellofemoral ligament was identified just slightly  proximal to the medial epicondyle.  In this area, the tendon was secured  with Bio-Tenodesis screw.  A 5-mm drill was then drilled through the  patella in an oblique fashion and the tendon was brought back on itself.  The tendon was isometric.  The tendon was sutured to itself, knee in 30  degrees of flexion, which gave the patient about half a patellar width  of lateral mobility with a firm endpoint.  Patient was taken through  range of motion and good isometry of the graft was noted.   Following  reconstruction of the medial patellofemoral ligament, the joint was  thoroughly irrigated.  Both incisions were closed using interrupted and  inverted 0 Vicryl suture, 2-0 Vicryl suture, and running 3-0 Prolene.  Solution of Marcaine, morphine, and clonidine was injected to the knee.  Patient tolerated the procedure well without immediate complication.  Bulky dressing was applied.      Burnard Bunting, M.D.  Electronically Signed     GSD/MEDQ  D:  04/30/2006  T:  04/30/2006  Job:  [id number]

## 2011-01-04 NOTE — Procedures (Signed)
Arthur Alvarez, Arthur Alvarez              ACCOUNT NO.:  [id number]   MEDICAL RECORD NO.:  [id number]          PATIENT TYPE:  REC   LOCATION:  TPC                          FACILITY:  [facility]   PHYSICIAN:  Erick Colace, M.D.DATE OF BIRTH:  November 15, 1970   DATE OF PROCEDURE:  10/02/2004  DATE OF DISCHARGE:                                 OPERATIVE REPORT   MEDICAL RECORD NUMBER:  [id number].   PROCEDURE:  Right knee aspiration.   INDICATIONS:  Right knee pain with swelling.   Informed consent was obtained after describing risks and benefits of the  procedure. The patient elects to proceed. The patient placed supine on exam  table. Betadine prep. Medial approach. Skin wheal raised with Betadine 1% x1  cc. An 18-gauge 1-1/2-inch needle was inserted into the right knee, medial  approach. Joint fluid aspirated, approximately 5 cc. Then Kenalog 40 mg/cc  plus lidocaine 1% was injected. The patient tolerated the procedure well.  Post injection instructions given. Return in two months.      AEK/MEDQ  D:  10/02/2004 15:11:44  T:  10/02/2004 16:06:29  Job:  [id number]

## 2011-01-04 NOTE — Procedures (Signed)
NAME:  Arthur Alvarez, Arthur Alvarez                        ACCOUNT NO.:  [id number]   MEDICAL RECORD NO.:  [id number]                   PATIENT TYPE:  REC   LOCATION:  [facility]                                 FACILITY:  [facility]   PHYSICIAN:  Erick Colace, M.D.           DATE OF BIRTH:  1970/09/09   DATE OF PROCEDURE:  12/26/2003  DATE OF DISCHARGE:                                 OPERATIVE REPORT   REASON FOR CONSULTATION:  The patient returns today after I last saw him on  December 05, 2003.  He is a 40 year old male with right lower extremity pain, a  football injury in 1986, an ACL rupture as well as moderate canal narrowing  at L4-5 and spondylolisthesis at that level.  He has had good response from  a right L4 transforaminal epidural steroid injection.  In terms of his leg  pain he has less hypersensitivity at the knee. His back pain has been  increasing over the last three days after improvement initially.   He is undergoing physical therapy.   CURRENT MEDICATIONS:  1. Vicodin 5/500 mg t.i.d.  2. Topamax 25 mg q.h.s.   He is on no anticoagulant medications.   DESCRIPTION OF PROCEDURE:  Informed consent was obtained after describing  the risks and benefits of the procedure with the patient.  A written consent  was received and the patient elected to proceed.   The patient was placed prone on the fluoroscopy table with Betadine prep and  sterile drape.  A 22 gauge, five inch needle was inserted under fluoroscopic  guidance after anesthetizing the skin and subcutaneous tissue with 1%  lidocaine times 3 mL.  Under fluoroscopic guidance, the patient was  manipulated into the subpedicular area of L4 not exceeding the 6 o'clock  position on the AP view.  Additional injection of Omnipaque 180 under live  fluoro showed no evidence of epidural spread.  The needle was repositioned  followed by additional injection of 0.5 mL of Omnipaque 180 demonstrating  epidural spread in outlying nerve root.   Then a solution containing 1 mL of  40 mg per mL Kenalog in 2 mL of 1% methylparaben free of lidocaine were  injected.  The patient tolerated the procedure well.  Post injection  instructions were given.  Pre-injection pain level 5 to 6 out of 10; post  injection 4 out of 10.   FOLLOW UP:  I will see the patient back in three to four [place] for an EMG  for left upper extremity tingling and history of neck surgery.  I have  written a prescription for Vicodin 7.5/500 mg one t.i.d. and Topamax 50 mg  p.o. q.h.s.  Erick Colace, M.D.    AEK/MEDQ  D:  12/26/2003 10:45:39  T:  12/26/2003 11:22:07  Job:  [id number]

## 2011-01-04 NOTE — Assessment & Plan Note (Signed)
INTERVAL HISTORY:  [age]-year-old male last seen by me April 29, 2005 has chronic right knee pain from osteoarthritis, multiple ACL  reconstructions as well as a right L4 radiculopathy due to spondylolisthesis  L4 on 5 causing numbness and pain right L4 dermatome has had good relief  with two L4 transforaminal epidural steroid injections in the past, the last  one was approximately 1 year ago.  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   CURRENT PAIN MEDICATIONS:  Morphine sulfate 30 mg p.o. q.12 h.  Morphine  short acting 15 mg one to two p.o. daily (50 tablets per month).  He has  held his Lyrica 150 p.o. twice a day on his primary physician's advice due  to elevated liver function tests.  He now has also stopped taking Tylenol.   PAST HISTORY:  His past history is also significant for long-term  Percocet/hydrocodone use.   PAIN LEVEL:  Pain level about 9/10, sharp as well as dull, stabbing as well  as constant.  No fluctuation day to night.  Relief from meds is about 50%  and interferes with general activity at 7 out of 10 level.   REVIEW OF SYSTEMS:  Otherwise negative.   PHYSICAL EXAMINATION:  VITAL SIGNS:  Blood pressure 126/77, pulse 93,  respiratory rate 16, O2 saturation 97% room air.  GENERAL:  No acute distress.  Mood and affect bright.  He does have a mild  valgus deformity right knee.  He has pain with even superficial palpation of  the right knee going down into the medial leg as well as lateral thigh.  His  back has tenderness to palpation bilateral L4-5 paraspinals.  He has good  spine range of motion.  He has good knee flexion-extension.  He has no  evidence of a knee effusion.  No erythema and no calf tenderness  bilaterally.  Motor  strength is full bilateral lower extremities.   IMPRESSION:  Recurrent right L4 radiculopathy superimposed on knee  osteoarthritis exacerbated by coming off his Lyrica.   PLAN:  1.  We will repeat right L4 transforaminal and epidural steroid injection.  2.  Resume Lyrica 150 p.o. twice a day.  3.  Continue MSIR 15 mg one to two tablets per day as breakthrough.  4.  Continue MS Contin 30 mg p.o. q.12 h.  I did search Epocrates database      and Lyrica does not produce elevation of liver enzymes.      Arthur Alvarez, M.D.  Electronically Signed     AEK/MedQ  D:  07/18/2005 13:33:42  T:  07/18/2005 14:59:08  Job #:  [id number]   cc:   Dyke Brackett, M.D.  Fax: [phone]

## 2011-01-04 NOTE — H&P (Signed)
[place]  Patient:    Arthur Alvarez, CONRAN Visit Number: [id number] MRN: [id number]          Service Type: SUR Location: 3000 3020 01 Attending Physician:  Emeterio Reeve Dictated by:   Payton Doughty, M.D. Admit Date:  12/29/2001 Discharge Date: 12/30/2001                           History and Physical  ADMITTING DIAGNOSIS: Spondylosis C4-5 and C5-6.  HISTORY OF PRESENT ILLNESS: This is a 40 year old right-handed white gentleman, who for a couple of months had increasing pain in his neck and also his right shoulder and arm, occasionally in his left shoulder.  MRI shows severe spondylitic change at C4-5 and C5-6, and he is now admitted for anterior cervical diskectomy and fusion at that level.  PAST MEDICAL HISTORY: Unremarkable for significant disease.  He has had occasional proteinuria.  MEDICATIONS:  1. Percocet.  2. Lidoderm.  PAST SURGICAL HISTORY:  1. Knee operation Dec 24, 1998.  2. Myringotomy and tubes 25 years ago.  3. Nose repair in 2001.  SOCIAL HISTORY: He does not smoke.  Drinks on a social basis.  Designer, industrial/product.  ALLERGIES:  1. PENICILLIN.  2. SULFA DRUGS.  FAMILY HISTORY: Mom is 44, in good health.  Dad is 40, with renal transplant.  REVIEW OF SYSTEMS: Remarkable for joint pain, arthritis, and neck pain.  HEENT: Within normal limits.  NECK: He has limited range of motion of his neck, somewhat stocky.  Turning his head toward the right reproduces right shoulder and arm pain.  Turning his head toward the left causes him some left interscapular and left shoulder pain.  CHEST: Clear.  CARDIAC: Regular rate and rhythm.  ABDOMEN: Nontender.  No hepatosplenomegaly.  EXTREMITIES: Without clubbing or cyanosis.  Peripheral pulses good.  GU: Examination deferred.  NEUROLOGIC: He is awake and alert and oriented.  Cranial nerves intact.  Motor examination shows 5/5 strength throughout the upper extremities save  for giveaway weakness of the left deltoid.  Sensation is intact.  Reflexes are flicker at the left biceps, absent at the right; 1 at the right triceps and 1 at the brachial radialis bilaterally.  Hoffmanns is negative.  Lower extremities are nonmyelopathic.  LABORATORY DATA: MRI demonstrates spondylitic change at 4-5 with a large foraminal osteophyte and hard disk encroaching on the left C5 root.  At 5-6 he has a disk on the right encroaching on the 6 root.  CLINICAL IMPRESSION: Cervical spondylosis of the left C5 and right C6 with radiculopathy, right side more effected.  PLAN: The plan is for anterior cervical diskectomy and fusion.  The risks and benefits of this approach have been discussed with him and he wishes to proceed. Dictated by:   Payton Doughty, M.D. Attending Physician:  Emeterio Reeve DD:  12/29/01 TD:  12/30/01 Job: [id number]

## 2011-01-04 NOTE — Op Note (Signed)
NAME:  Arthur Alvarez, Arthur Alvarez                        ACCOUNT NO.:  [id number]   MEDICAL RECORD NO.:  [id number]                   PATIENT TYPE:  OIB   LOCATION:  2874                                 FACILITY:  [facility]   PHYSICIAN:  Vikki Ports, M.D.         DATE OF BIRTH:  06-Aug-1971   DATE OF PROCEDURE:  08/13/2002  DATE OF DISCHARGE:                                 OPERATIVE REPORT   PREOPERATIVE DIAGNOSIS:  Left axillary mass.   POSTOPERATIVE DIAGNOSIS:  Left axillary mass.   PROCEDURE:  Left axillary exploration.   ANESTHESIA:  General.   SURGEON:  Vikki Ports, M.D.   CLINICAL NOTE:  The patient is a 39 year old white male with a three to four-  week history of tender left axillary mass.  The patient was seen and  evaluated by his primary care physician and started on p.o. antibiotics.  After four to five days of antibiotics, the patient had no response and  continued with discomfort and enlargement of the left axillary mass and was  referred to me for further evaluation.  FNA performed in the office was  nondiagnostic.  The patient now presents for left axillary exploration and  left axillary lymph node biopsy.  The indication, risks, benefits, and  options were explained to the patient and informed consent was granted.   DESCRIPTION OF PROCEDURE:  The patient was taken to the operating room and  placed in the supine position.  After adequate anesthesia was induced using  laryngeal mask, the left upper chest and axilla were prepped and draped in  the normal sterile fashion.  Using a transverse incision just lateral to the  lateral border of the pectoralis major muscle, I dissected down through  subcutaneous fat.  The lateral border of the pectoralis major muscle was  identified and retracted medially.  The muscle was very, very thick in this  large man and a number of fibers had to be divided.  The pectoralis minor  muscle was also identified and retracted  superiorly.  The mass was quite  deep with respect to the large muscle mass of the pectoralis muscles.  Further muscle fibers were divided.  The mass could be palpated but not well-  visualized.  The axillary artery and vein were identified.  The mass lay  just superior to this.  I could not get adequate mobilization of the mass in  any direction and because of the proximity to the axillary vessels and the  brachial plexus, I opted to abort the operation and follow up with a chest  CT scan after the patient has recovered in the postanesthesia care unit.  Adequate hemostasis was assured.  The subcutaneous tissue was closed with a  running 2-0 Vicryl, skin was closed with staples.  The patient tolerated the  procedure well and went to PACU in good condition.  Vikki Ports, M.D.    KRH/MEDQ  D:  08/13/2002  T:  08/13/2002  Job:  [id number]

## 2011-01-04 NOTE — Group Therapy Note (Signed)
Arthur Alvarez is a 40 year old male who has chronic knee pain which originated  from a football injury in 1986 and 1987 where he had ACL rupture, MCL  rupture, and what sounds like medial meniscal tear.  He did have an open ACL  reconstruction at that time.  He was reoperated on in 1998.  In addition to  these surgeries, he has had multiple arthroscopic surgeries.  He has  hypersensitivity to touch over the right knee and feelings of instability.  He has had some locking of the knee as well.  The pain is made worse by  walking, bending, sitting, and working.  In addition, he has low back pain  which sometimes goes into the right hip.  His left leg seems to go numb  easier than the right leg.  However, he does not have any shooting pain from  his back down to his legs.  He has had lumbar injections x 6 to his low back  from Dr. Lew Dawes.  His last injection was sometime in November or December  per his report.  He is also undergoing treatment per Dr. Madelon Lips.  He has  had a Synvisc injection to the right knee today.  Additional imaging studies  include an MRI of the lumbar spine dated October 20, 2002, showing disk  desiccation at L4-5, posterior listhesis of L4 on L5, moderate central canal  narrowing at L4-5, mild facet arthropathy at that level, and mild foraminal  narrowing at that level, as well as L5-S1.  He had MRI of the cervical spine  on May 27, 2002, showing tiny disk protrusion at L3-4.  He had a fused  segment at C4-C6 which was done in 2002 by Dr. Channing Mutters.   He has been tried on OxyContin.  He was up to 80 mg q.8h. and Percocet  10/325 mg three times a day.  He came of the OxyContin after Dr. Lew Dawes  closed his practice and has just been maintained on Percocet.  He states  that his optimum pain relief actually came when he was at 40 mg b.i.d. of  OxyContin.  He states that he had been tried by Dr. Quintella Reichert in the past on a  Duragesic patch.  He thought it was not the weakest strength, but  at least  one of the middle strengths.  This made him nauseated and he had to stop  taking it.  His current pain medication includes Percocet 5/325 mg one p.o.  q.i.d.  He is on Soma 350 mg p.o. p.r.n.  He takes this only on occasion.  He takes Advil on a p.r.n. basis and a multivitamin, as well as vitamin C.   The pain averages 7-8/10.  He states that his knee is usually worse than his  back.  As noted above.  In addition, he has had tonsillectomy in 2001 and  exploratory biopsy in the axilla on the left side for lymphadenopathy which  was felt to be due to cat scratch fever.  His pain exacerbating factors are  walking, bending, sitting, and working.  Alleviating factors are rest and  heat, but more so ice to the knee.   ALLERGIES:  1. PENICILLIN.  2. SULFA.   SOCIAL HISTORY:  He had a DUI in 1994.  Some illegal drug use when he was  younger.  He uses dip.  He is married.  He is working full-time as a  Designer, industrial/product.  He lives up to 70 pounds on a daily basis.  He has a 64-  year-old son.   REVIEW OF SYSTEMS:  He has had cold and coughing.  He has had numbness in  his right lower extremity.  Poor sleep.  No suicidal thoughts.  No  depression or anxiety, although he does admit that the pain sometimes gets  to him.  He has nausea and vomiting on occasion with reflux, heartburn, and  diarrhea alternating with constipation.   Other problems include fever, chills, and swelling.  He has not had any  weight loss.  His weight is listed at 260.   PHYSICAL EXAMINATION:  The blood pressure is 150/85, pulse 89, respirations  16, and O2 saturation 99% on room air.  Generally in no acute distress.  Mood and affect bright, appropriate, and alert.  He moves his neck quite  well.  In his back, he has 75% forward flexion and forward bending is the  most painful maneuver.  He has 50% extension and 50% lateral rotation and  bending.  His knee has range of motion from full extension from 120  degrees  of flexion.  He has hypersensitivity to touch over the scars of his right  knee.  He has good internal and external rotation of his hips bilaterally.  He has full range of motion at the ankle.  He has normal deep tendon  reflexes of bilateral upper and lower extremities.  He has normal strength  in bilateral upper and lower extremities.  He has mild valgus deformity of  the right knee compared to the left side.  His knee x-rays that I reviewed  showed no evidence of joint space narrowing.  His back has no tenderness to  palpation.  His sensation is reduced at the knee and below the right lower  extremity.   IMPRESSION:  1. Right chronic knee pain.  I feel that he has scar supersensitivity rather     than a true RSD-type picture.  This is his primary problem.  2. Low back pain with some central canal stenosis at L4-5.  He may have some     radicular-type symptoms that are overlapping with his chronic right knee     pain and exacerbating these symptoms as well.  3. Mild deconditioning.  His hip flexor and hip abductor strength at less     than one would expect given a man his size, although still grade as full.  4. Valgus deformity of the right knee.  He also tends to bear weight across     the lateral foot.  He may benefit from some orthotic evaluation.  5. History of anterior cruciate ligament laxity, chondromalacia, and lateral     and medial meniscal tears, status post meniscectomies.   RECOMMENDATIONS:  1. In terms of procedures, would recommend right L4 transforaminal epidural     sternoid injection both as a diagnostic and therapeutic procedure.  2. Referral to physical therapy to work on lumbar stiffness and go over     appropriate spine exercises and cord stabilization exercises and overall     conditioning.  3. I would also like physical therapy to address some of the scar     supersensitivity and do some desensitization treatments. 4. Will start Topamax 25 mg q.h.s.   Will slowly build up on this.  This may     have a beneficial side effect of weight loss.  5. Pain psychology with Gladstone Pih, Ph.D.  6. Will start him on a Duragesic patch 25 mcg q.72h.  7. Lidoderm patch to the knee, on 12 and off 12.   I will see him back in one month or sooner for the injection.     Erick Colace, M.D.   AEK/MedQ  D:  11/24/2003 17:33:49  T:  11/25/2003 16:10:96  Job #:  [id number]   cc:   Thera Flake., M.D.  [address] [place]  [place] [address]  Fax: [phone]   Gladstone Pih, Ph.D.  [address] [place]  [place] [address]

## 2011-01-04 NOTE — Assessment & Plan Note (Signed)
HISTORY:  Chronic right knee pain, has also a right L4 radiculopathy.  Has  not had any further aspiration since I saw him on February 07, 2005.  He has  been followed in the nursing clinic.  Activity level is good.  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  He is doing his yard work on a regular basis now.   CURRENT PAIN MEDICINES:  1.  Morphine sulfate 30 mg p.o. q.12h.  2.  Morphine sulfate short-acting 15 mg one and a half p.o. daily.  He ran      out five days early on the short acting but is appropriate on his counts      with the long acting.  3.  He continues on Lyrica 150 mg p.o. b.i.d.   INTERVAL HISTORY:  Started on a new blood pressure medication by his primary  care physician.   Last injection was done in February of 2006, which was a right L4  transforaminal epidural steroid injection under fluoroscopic guidance.   PHYSICAL EXAMINATION:  GENERAL APPEARANCE:  No acute distress.  Mood and  affect appropriate.  BACK:  Some tenderness to palpation of the lumbosacral junction.  Back range  of motion is 75% forward flexion, 75% extension, 50% lateral rotation and  bending.   He has normal deep tendon reflexes bilateral lower extremities.  Normal  muscle strength in bilateral hip flexors, knee extensors and ankle  dorsiflexors.  Gait is without to drag or knee instability.  He has no  evidence of right knee effusion but has old surgical scars but not tender to  palpation.   IMPRESSION:  1.  Right L4 radiculopathy, controlled symptoms with Lyrica.  2.  Right knee chronic pain osteoarthritis, also status post multiple      surgeries with some history of scar paresthesia controlled well with      Lyrica.  3.  Narcotic tolerance with increased activity level and I think that this      represents rapid escalation of dose are noncompliance and he certainly      is doing well in terms of activity level and attempts at weight loss      which should be beneficial for him in the long run.  Will add an extra      five tablets of the short acting morphine per month; i.e., 50 tablets of      the MSIR 15 mg.   Should he have further dosage escalation in the next month, I think that  would be a sign that his right L4 transforaminal epidural steroid injection  has worn off and a reinjection will be needed.  He will follow up at the  nearest clinic for further monitoring.  If any aberrant behavior occurs,  consider repeat urine drug screen.      Erick Colace, M.D.  Electronically Signed     AEK/MedQ  D:  04/29/2005 13:03:15  T:  04/29/2005 14:50:07  Job #:  [id number]   cc:   Dyke Brackett, M.D.  Fax: [phone]

## 2011-05-16 LAB — RAPID URINE DRUG SCREEN, HOSP PERFORMED
Amphetamines: NOT DETECTED
Barbiturates: NOT DETECTED
Benzodiazepines: POSITIVE — AB
Cocaine: NOT DETECTED
Opiates: POSITIVE — AB
Tetrahydrocannabinol: NOT DETECTED

## 2011-05-16 LAB — ETHANOL: Alcohol, Ethyl (B): <5

## 2011-05-16 LAB — POCT I-STAT, CHEM 8
Chloride: 108
HCT: 50
Hemoglobin: 17
Potassium: 5.2 — ABNORMAL HIGH
Sodium: 139

## 2014-05-10 ENCOUNTER — Ambulatory Visit (INDEPENDENT_AMBULATORY_CARE_PROVIDER_SITE_OTHER): Payer: BC Managed Care – PPO | Admitting: Emergency Medicine

## 2014-05-10 VITALS — BP 138/80 | HR 88 | Temp 98.2°F | Resp 16 | Ht 71.0 in | Wt 240.0 lb

## 2014-05-10 DIAGNOSIS — G894 Chronic pain syndrome: Secondary | ICD-10-CM

## 2014-05-10 MED ORDER — OXYCODONE HCL 30 MG PO TABS: 30.0000 mg | ORAL_TABLET | ORAL | Status: DC | PRN

## 2014-05-10 MED ORDER — MORPHINE SULFATE ER 15 MG PO TBCR
15.0000 mg | EXTENDED_RELEASE_TABLET | Freq: Two times a day (BID) | ORAL | Status: DC
Start: 2014-05-10 — End: 2014-05-18

## 2014-05-10 MED ORDER — DIAZEPAM 10 MG PO TABS
10.0000 mg | ORAL_TABLET | Freq: Every evening | ORAL | Status: DC | PRN
Start: 2014-05-10 — End: 2014-05-18

## 2014-05-10 NOTE — Progress Notes (Addendum)
[facility] [address] [place] [address] [phone]  Date:  05/10/2014   Name:  Arthur Alvarez   DOB:  05/28/1971   MRN:  [id number]  PCP:  No PCP Per Patient    Chief Complaint: Medication Refill   History of Present Illness:  Arthur Alvarez is a 43 y.o. very pleasant male patient who presents with the following:  Is on chronic pain management for "10 years".  Went for refills today and was denied because his pill count was short. Has appt on 10/13 after his drug screen results are completed. He claims they have authorized him to go to another provider, despite his contract, to obtain medications pending his next appt. No improvement with over the counter medications or other home remedies. . Denies other complaint or health concern today.    There are no active problems to display for this patient.   Past Medical History  Diagnosis Date  . Arthritis   . Neuromuscular disorder     Past Surgical History  Procedure Laterality Date  . Joint replacement    . Spine surgery      History  Substance Use Topics  . Smoking status: Never Smoker   . Smokeless tobacco: Current User    Types: Snuff  . Alcohol Use: Not on file    Family History  Problem Relation Age of Onset  . Hyperlipidemia Mother     Allergies  Allergen Reactions  . Penicillins   . Sulfa Antibiotics     Medication list has been reviewed and updated.  No current outpatient prescriptions on file prior to visit.   No current facility-administered medications on file prior to visit.    Review of Systems:  As per HPI, otherwise negative.    Physical Examination: Filed Vitals:   05/10/14 1547  BP: 138/80  Pulse: 88  Temp: 98.2 F (36.8 C)  Resp: 16   Filed Vitals:   05/10/14 1547  Height:  (1.803 m)  Weight: 240 lb (108.863 kg)   Body mass index is 33.49 kg/(m^2). Ideal Body Weight: Weight in (lb) to have BMI = 25: 178.9  GEN: WDWN, NAD,  Non-toxic, A & O x 3 HEENT: Atraumatic, Normocephalic. Neck supple. No masses, No LAD. Ears and Nose: No external deformity. CV: RRR, No M/G/R. No JVD. No thrill. No extra heart sounds. PULM: CTA B, no wheezes, crackles, rhonchi. No retractions. No resp. distress. No accessory muscle use. ABD: S, NT, ND, +BS. No rebound. No HSM. EXTR: No c/c/e  Extensive surgical scars on both legs and shoulder NEURO Normal gait.  PSYCH: Normally interactive. Conversant. Not depressed or anxious appearing.  Calm demeanor.    Assessment and Plan: Chronic pain Prescriptions for one week of medication  Signed,  Phillips Odor, MD   I reviewed the [facility]DS[facility] print out and confirmed his medication history .  He was prescribed 7 days medication.

## 2014-05-10 NOTE — Patient Instructions (Signed)
[facility] Policy for Prescribing Controlled Substances (Revised 06/2012) 1. Prescriptions for controlled substances will be filled by ONE provider at [facility] with whom you have established and developed a plan for your care, including follow-up. 2. You are encouraged to schedule an appointment with your prescriber at our appointment center for follow-up visits whenever possible. 3. If you request a prescription for the controlled substance while at [facility] for an acute problem (with someone other than your regular prescriber), you MAY be given a ONE-TIME prescription for a 30-day supply of the controlled substance, to allow time for you to return to see your regular prescriber for additional prescriptions.    Chronic Pain Chronic pain can be defined as pain that is off and on and lasts for 3-6 months or longer. Many things cause chronic pain, which can make it difficult to make a diagnosis. There are many treatment options available for chronic pain. However, finding a treatment that works well for you may require trying various approaches until the right one is found. Many people benefit from a combination of two or more types of treatment to control their pain. SYMPTOMS  Chronic pain can occur anywhere in the body and can range from mild to very severe. Some types of chronic pain include:  Headache.  Low back pain.  Cancer pain.  Arthritis pain.  Neurogenic pain. This is pain resulting from damage to nerves. People with chronic pain may also have other symptoms such as:  Depression.  Anger.  Insomnia.  Anxiety. DIAGNOSIS  Your health care provider will help diagnose your condition over time. In many cases, the initial focus will be on excluding possible conditions that could be causing the pain. Depending on your symptoms, your health care provider may order tests to diagnose your condition. Some of these tests may include:   Blood tests.   CT scan.   MRI.   X-rays.    Ultrasounds.   Nerve conduction studies.  You may need to see a specialist.  TREATMENT  Finding treatment that works well may take time. You may be referred to a pain specialist. He or she may prescribe medicine or therapies, such as:   Mindful meditation or yoga.  Shots (injections) of numbing or pain-relieving medicines into the spine or area of pain.  Local electrical stimulation.  Acupuncture.   Massage therapy.   Aroma, color, light, or sound therapy.   Biofeedback.   Working with a physical therapist to keep from getting stiff.   Regular, gentle exercise.   Cognitive or behavioral therapy.   Group support.  Sometimes, surgery may be recommended.  HOME CARE INSTRUCTIONS   Take all medicines as directed by your health care provider.   Lessen stress in your life by relaxing and doing things such as listening to calming music.   Exercise or be active as directed by your health care provider.   Eat a healthy diet and include things such as vegetables, fruits, fish, and lean meats in your diet.   Keep all follow-up appointments with your health care provider.   Attend a support group with others suffering from chronic pain. SEEK MEDICAL CARE IF:   Your pain gets worse.   You develop a new pain that was not there before.   You cannot tolerate medicines given to you by your health care provider.   You have new symptoms since your last visit with your health care provider.  SEEK IMMEDIATE MEDICAL CARE IF:   You feel weak.     You have decreased sensation or numbness.   You lose control of bowel or bladder function.   Your pain suddenly gets much worse.   You develop shaking.  You develop chills.  You develop confusion.  You develop chest pain.  You develop shortness of breath.  MAKE SURE YOU:  Understand these instructions.  Will watch your condition.  Will get help right away if you are not doing well or get  worse. Document Released: 04/27/2002 Document Revised: 04/07/2013 Document Reviewed: 01/29/2013 ExitCare Patient Information 2015 [employer]. This information is not intended to replace advice given to you by your health care provider. Make sure you discuss any questions you have with your health care provider.  

## 2014-05-17 ENCOUNTER — Ambulatory Visit (INDEPENDENT_AMBULATORY_CARE_PROVIDER_SITE_OTHER): Payer: BC Managed Care – PPO | Admitting: Family Medicine

## 2014-05-17 VITALS — BP 147/96 | HR 82 | Temp 98.9°F | Resp 12 | Ht 70.0 in | Wt 236.1 lb

## 2014-05-17 DIAGNOSIS — G894 Chronic pain syndrome: Secondary | ICD-10-CM

## 2014-05-17 DIAGNOSIS — F1120 Opioid dependence, uncomplicated: Secondary | ICD-10-CM

## 2014-05-17 NOTE — Patient Instructions (Signed)
[facility] Policy for Prescribing Controlled Substances (Revised 06/2012) 1. Prescriptions for controlled substances will be filled by ONE provider at [facility] with whom you have established and developed a plan for your care, including follow-up. 2. You are encouraged to schedule an appointment with your prescriber at our appointment center for follow-up visits whenever possible. 3. If you request a prescription for the controlled substance while at [facility] for an acute problem (with someone other than your regular prescriber), you MAY be given a ONE-TIME prescription for a 30-day supply of the controlled substance, to allow time for you to return to see your regular prescriber for additional prescriptions. 4.  

## 2014-05-17 NOTE — Progress Notes (Signed)
[facility] [address] [place] [address] [phone]  Date:  05/17/2014   Name:  Arthur Alvarez   DOB:  [birth year]   MRN:  [id number]  PCP:  No PCP Per Patient    Chief Complaint: Medication Refill and Flu Vaccine   History of Present Illness:  Arthur Alvarez is a 43 y.o. very pleasant male patient who presents with the following:  Is on chronic pain management for "10 years" has been seen at the [facility].  Went for refills about 1 week ago and because his pill count was short he was discharged from the practice He reports he has appt on 10/20 with a Dr. Alysia Penna but told Dr. Dareen Piano last week he had apt on 10/13 with [facility]  after his drug screen results are completed. He claims they have authorized him to go to another provider, despite his contract, to obtain medications pending his next appt. The form he brought in this evening just reports that no medication will long be prescribed by [facility] and he has been discharged Patient was give a 1 week supply of medication by Dr. Dareen Piano on 05/10/14 with the intension that patient would establish care at pain management by that time but has not.  Patient also reports that Dr. Dareen Piano told him he could have another week supply of his chronic medication if needed, which was not documented anywhere in his note No improvement with over the counter medications or other home remedies. . Denies other complaint or health concern today.    There are no active problems to display for this patient.   Past Medical History  Diagnosis Date  . Arthritis   . Neuromuscular disorder     Past Surgical History  Procedure Laterality Date  . Joint replacement    . Spine surgery      History  Substance Use Topics  . Smoking status: Never Smoker   . Smokeless tobacco: Current User    Types: Snuff  . Alcohol Use: No    Family History  Problem Relation Age of Onset  . Hyperlipidemia Mother      Allergies  Allergen Reactions  . Penicillins   . Sulfa Antibiotics     Medication list has been reviewed and updated.  Current Outpatient Prescriptions on File Prior to Visit  Medication Sig Dispense Refill  . diazepam (VALIUM) 10 MG tablet Take 1 tablet (10 mg total) by mouth at bedtime as needed for anxiety.  7 tablet  0  . morphine (MS CONTIN) 15 MG 12 hr tablet Take 1 tablet (15 mg total) by mouth every 12 (twelve) hours.  14 tablet  0  . oxycodone (ROXICODONE) 30 MG immediate release tablet Take 1 tablet (30 mg total) by mouth every 4 (four) hours as needed for pain.  42 tablet  0   No current facility-administered medications on file prior to visit.    Review of Systems:  As per HPI, otherwise negative.    Physical Examination: Filed Vitals:   05/17/14 1900  BP: 147/96  Pulse: 82  Temp: 98.9 F (37.2 C)  Resp: 12   Filed Vitals:   05/17/14 1900  Height: 5\' 10"  (1.778 m)  Weight: 236 lb 2 oz (107.106 kg)   Body mass index is 33.88 kg/(m^2). Ideal Body Weight: Weight in (lb) to have BMI = 25: 173.9  GEN: WDWN, NAD, Non-toxic, A & O x 3 HEENT: Atraumatic, Normocephalic. Neck supple. No  masses, No LAD. Ears and Nose: No external deformity. CV: RRR, No M/G/R. No JVD. No thrill. No extra heart sounds. PULM: CTA B, no wheezes, crackles, rhonchi. No retractions. No resp. distress. No accessory muscle use. ABD: S, NT, ND, +BS. No rebound. No HSM. EXTR: No c/c/e  Extensive surgical scars on both legs and shoulder NEURO Normal gait.  PSYCH: Normally interactive. Conversant. Not depressed or anxious appearing.  Calm demeanor.    Assessment and Plan: Chronic pain with continued opioid dependence   No medication was prescribed this evening.  No charged patient apt Recommend follow up with Dareen PianoAnderson tomorrow anytime from 2-8:30 since he has a relationship with Dr. Dareen PianoAnderson and believe that Dr. Dareen PianoAnderson told him he would prescribe more medication if need.     Patient also provided with control substance policy  I reviewed the [facility] DMHDDSAS print out and confirmed his medication history .

## 2014-05-18 ENCOUNTER — Ambulatory Visit (INDEPENDENT_AMBULATORY_CARE_PROVIDER_SITE_OTHER): Payer: BC Managed Care – PPO | Admitting: Emergency Medicine

## 2014-05-18 VITALS — BP 148/92 | HR 90 | Temp 98.4°F | Resp 20 | Ht 70.0 in | Wt 232.6 lb

## 2014-05-18 DIAGNOSIS — G894 Chronic pain syndrome: Secondary | ICD-10-CM

## 2014-05-18 DIAGNOSIS — F1120 Opioid dependence, uncomplicated: Secondary | ICD-10-CM

## 2014-05-18 MED ORDER — DIAZEPAM 10 MG PO TABS: 10.0000 mg | ORAL_TABLET | Freq: Every evening | ORAL | Status: DC | PRN

## 2014-05-18 MED ORDER — OXYCODONE HCL 30 MG PO TABS: 30.0000 mg | ORAL_TABLET | Freq: Four times a day (QID) | ORAL | Status: DC | PRN

## 2014-05-18 MED ORDER — MORPHINE SULFATE ER 15 MG PO TBCR: 15.0000 mg | EXTENDED_RELEASE_TABLET | Freq: Two times a day (BID) | ORAL | Status: DC

## 2014-05-18 NOTE — Progress Notes (Signed)
Discussed the management of the patient and agreed that multiple ones of us cannot providing him with pain medication. He needs to see the original provider to see if he is willing to continue pain meds until he can see a specialist.

## 2014-05-18 NOTE — Progress Notes (Signed)
[facility] [address], [place] [id number] [phone]  Date:  05/18/2014   Name:  Arthur Alvarez   DOB:  10/24/1970   MRN:  [id number]  PCP:  No PCP Per Patient    Chief Complaint: Follow-up   History of Present Illness:  Arthur Alvarez is a [age] y.o. very pleasant male patient who presents with the following:  [facility]  He claims an appt with another doctor on the 20th. There is no evidence of withdrawal in the patient at the present time Denies other complaint or health concern today.   Patient Active Problem List   Diagnosis Date Noted  . Chronic pain syndrome 05/17/2014  . Opioid type dependence, continuous 05/17/2014    Past Medical History  Diagnosis Date  . Arthritis   . Neuromuscular disorder     Past Surgical History  Procedure Laterality Date  . Joint replacement    . Spine surgery      History  Substance Use Topics  . Smoking status: Never Smoker   . Smokeless tobacco: Current User    Types: Snuff  . Alcohol Use: No    Family History  Problem Relation Age of Onset  . Hyperlipidemia Mother     Allergies  Allergen Reactions  . Penicillins   . Sulfa Antibiotics     Medication list has been reviewed and updated.  Current Outpatient Prescriptions on File Prior to Visit  Medication Sig Dispense Refill  . diazepam (VALIUM) 10 MG tablet Take 1 tablet (10 mg total) by mouth at bedtime as needed for anxiety.  7 tablet  0  . morphine (MS CONTIN) 15 MG 12 hr tablet Take 1 tablet (15 mg total) by mouth every 12 (twelve) hours.  14 tablet  0  . oxycodone (ROXICODONE) 30 MG immediate release tablet Take 1 tablet (30 mg total) by mouth every 4 (four) hours as needed for pain.  42 tablet  0   No current facility-administered medications on file prior to visit.    Review of Systems:  As per HPI, otherwise negative.    Physical Examination: Filed Vitals:   05/18/14 1901  BP: 148/92  Pulse: 90  Temp: 98.4 F (36.9 C)  Resp: 20   Filed Vitals:   05/18/14 1901  Height: 5\' 10"  (1.778 m)  Weight: 232 lb 9.6 oz (105.507 kg)   Body mass index is 33.37 kg/(m^2). Ideal Body Weight: Weight in (lb) to have BMI = 25: 173.9   GEN: WDWN, NAD, Non-toxic, Alert & Oriented x 3 HEENT: Atraumatic, Normocephalic.  Ears and Nose: No external deformity. EXTR: No clubbing/cyanosis/edema NEURO: Normal gait.  PSYCH: Normally interactive. Conversant. Not depressed or anxious appearing.  Calm demeanor.    Assessment and Plan: Chronic pain syndrome Patient prescribed one final week of medication He will need to find care at another pain clinic.  I  Informed him I will not refill his meds again.  Signed,  Phillips OdorJeffery Ermal Brzozowski, MD

## 2014-05-25 ENCOUNTER — Emergency Department (HOSPITAL_BASED_OUTPATIENT_CLINIC_OR_DEPARTMENT_OTHER)
Admission: EM | Admit: 2014-05-25 | Discharge: 2014-05-25 | Disposition: A | Discharge status: Home / Self Care | Payer: BC Managed Care – PPO | Attending: Emergency Medicine | Admitting: Emergency Medicine

## 2014-05-25 ENCOUNTER — Encounter (HOSPITAL_BASED_OUTPATIENT_CLINIC_OR_DEPARTMENT_OTHER): Payer: Self-pay | Admitting: Emergency Medicine

## 2014-05-25 DIAGNOSIS — Z87828 Personal history of other (healed) physical injury and trauma: Secondary | ICD-10-CM | POA: Insufficient documentation

## 2014-05-25 DIAGNOSIS — G8929 Other chronic pain: Secondary | ICD-10-CM | POA: Insufficient documentation

## 2014-05-25 DIAGNOSIS — Z9889 Other specified postprocedural states: Secondary | ICD-10-CM | POA: Insufficient documentation

## 2014-05-25 DIAGNOSIS — Z880 Allergy status to penicillin: Secondary | ICD-10-CM | POA: Diagnosis not present

## 2014-05-25 DIAGNOSIS — Z8739 Personal history of other diseases of the musculoskeletal system and connective tissue: Secondary | ICD-10-CM | POA: Diagnosis not present

## 2014-05-25 DIAGNOSIS — M25561 Pain in right knee: Secondary | ICD-10-CM | POA: Diagnosis present

## 2014-05-25 MED ORDER — OXYCODONE HCL 15 MG PO TABS: 15.0000 mg | ORAL_TABLET | ORAL | Status: DC | PRN

## 2014-05-25 MED ORDER — OXYCODONE-ACETAMINOPHEN 5-325 MG PO TABS
2.0000 | ORAL_TABLET | Freq: Once | ORAL | Status: AC
  Administered 2014-05-25: 2 via ORAL
  Filled 2014-05-25: qty 2

## 2014-05-25 NOTE — Discharge Instructions (Signed)
Cryotherapy °Cryotherapy means treatment with cold. Ice or gel packs can be used to reduce both pain and swelling. Ice is the most helpful within the first 24 to 48 hours after an injury or flare-up from overusing a muscle or joint. Sprains, strains, spasms, burning pain, shooting pain, and aches can all be eased with ice. Ice can also be used when recovering from surgery. Ice is effective, has very few side effects, and is safe for most people to use. °PRECAUTIONS  °Ice is not a safe treatment option for people with: °· Raynaud phenomenon. This is a condition affecting small blood vessels in the extremities. Exposure to cold may cause your problems to return. °· Cold hypersensitivity. There are many forms of cold hypersensitivity, including: °¨ Cold urticaria. Red, itchy hives appear on the skin when the tissues begin to warm after being iced. °¨ Cold erythema. This is a red, itchy rash caused by exposure to cold. °¨ Cold hemoglobinuria. Red blood cells break down when the tissues begin to warm after being iced. The hemoglobin that carry oxygen are passed into the urine because they cannot combine with blood proteins fast enough. °· Numbness or altered sensitivity in the area being iced. °If you have any of the following conditions, do not use ice until you have discussed cryotherapy with your caregiver: °· Heart conditions, such as arrhythmia, angina, or chronic heart disease. °· High blood pressure. °· Healing wounds or open skin in the area being iced. °· Current infections. °· Rheumatoid arthritis. °· Poor circulation. °· Diabetes. °Ice slows the blood flow in the region it is applied. This is beneficial when trying to stop inflamed tissues from spreading irritating chemicals to surrounding tissues. However, if you expose your skin to cold temperatures for too long or without the proper protection, you can damage your skin or nerves. Watch for signs of skin damage due to cold. °HOME CARE INSTRUCTIONS °Follow  these tips to use ice and cold packs safely. °· Place a dry or damp towel between the ice and skin. A damp towel will cool the skin more quickly, so you may need to shorten the time that the ice is used. °· For a more rapid response, add gentle compression to the ice. °· Ice for no more than 10 to 20 minutes at a time. The bonier the area you are icing, the less time it will take to get the benefits of ice. °· Check your skin after 5 minutes to make sure there are no signs of a poor response to cold or skin damage. °· Rest 20 minutes or more between uses. °· Once your skin is numb, you can end your treatment. You can test numbness by very lightly touching your skin. The touch should be so light that you do not see the skin dimple from the pressure of your fingertip. When using ice, most people will feel these normal sensations in this order: cold, burning, aching, and numbness. °· Do not use ice on someone who cannot communicate their responses to pain, such as small children or people with dementia. °HOW TO MAKE AN ICE PACK °Ice packs are the most common way to use ice therapy. Other methods include ice massage, ice baths, and cryosprays. Muscle creams that cause a cold, tingly feeling do not offer the same benefits that ice offers and should not be used as a substitute unless recommended by your caregiver. °To make an ice pack, do one of the following: °· Place crushed ice or a   bag of frozen vegetables in a sealable plastic bag. Squeeze out the excess air. Place this bag inside another plastic bag. Slide the bag into a pillowcase or place a damp towel between your skin and the bag.  Mix 3 parts water with 1 part rubbing alcohol. Freeze the mixture in a sealable plastic bag. When you remove the mixture from the freezer, it will be slushy. Squeeze out the excess air. Place this bag inside another plastic bag. Slide the bag into a pillowcase or place a damp towel between your skin and the bag. SEEK MEDICAL CARE  IF:  You develop white spots on your skin. This may give the skin a blotchy (mottled) appearance.  Your skin turns blue or pale.  Your skin becomes waxy or hard.  Your swelling gets worse. MAKE SURE YOU:   Understand these instructions.  Will watch your condition.  Will get help right away if you are not doing well or get worse. Document Released: 04/01/2011 Document Revised: 12/20/2013 Document Reviewed: 04/01/2011 [facility] Patient Information 2015 [employer] [place]. This information is not intended to replace advice given to you by your health care provider. Make sure you discuss any questions you have with your health care provider.  Emergency Department Resource Guide 1) Find a Doctor and Pay Out of Pocket Although you won't have to find out who is covered by your insurance plan, it is a good idea to ask around and get recommendations. You will then need to call the office and see if the doctor you have chosen will accept you as a new patient and what types of options they offer for patients who are self-pay. Some doctors offer discounts or will set up payment plans for their patients who do not have insurance, but you will need to ask so you aren't surprised when you get to your appointment.  2) Contact Your Local Health Department Not all health departments have doctors that can see patients for sick visits, but many do, so it is worth a call to see if yours does. If you don't know where your local health department is, you can check in your phone book. The CDC also has a tool to help you locate your state's health department, and many state websites also have listings of all of their local health departments.  3) Find a Walk-in Clinic If your illness is not likely to be very severe or complicated, you may want to try a walk in clinic. These are popping up all over the country in pharmacies, drugstores, and shopping centers. They're usually staffed by nurse practitioners or physician  assistants that have been trained to treat common illnesses and complaints. They're usually fairly quick and inexpensive. However, if you have serious medical issues or chronic medical problems, these are probably not your best option.  No Primary Care Doctor: - Call Health Connect at  [phone] - they can help you locate a primary care doctor that  accepts your insurance, provides certain services, etc. - Physician Referral Service- [phone]  Chronic Pain Problems: Organization         Address  Phone   Notes  [facility]  [phone] Patients need to be referred by their primary care doctor.   Medication Assistance: Organization         Address  Phone   Notes  [facility] [address] [place], [place] [id number] [phone] --Must be a resident of [place] --  Must have NO insurance coverage whatsoever (no Medicaid/ Medicare, etc.) °-- The pt. MUST have a primary care doctor that directs their care regularly and follows them in the community °  °MedAssist  [phone]   °[employer] Way  [phone] [phone]   ° °Agencies that provide inexpensive medical care: °Organization         Address  Phone   Notes  °[facility]  [phone]   °[facility]    ([phone]   °[facility] [address] °[place], [place] [address] [phone]) [phone]   °[facility] [address], °[place] ([phone]) [phone]   °[facility]    ([phone]) [phone]   °[facility]    ([phone]) [phone]   °[facility] ° [address], [place] Phone:  ([phone]) [phone], Fax:  [phone] Hours of Operation:  9 am - 6 pm, M-F.  Also accepts Medicaid/Medicare and self-pay.  °[facility] ° [address], [place] Phone: [phone], Fax: [phone]. Hours of Operation:  8:30 am - 5:30 pm, M-F.  Also accepts  Medicaid and self-pay.  °[facility] [place] [address], [place] Phone: ([phone]) [phone]   °[facility] [address], [place], [place] ([phone], Ext. 123 Mondays & Thursdays: 7-9 AM.  First 15 patients are seen on a first come, first serve basis. °  ° °Medicaid-accepting [place] Providers: ° °Organization         Address  Phone   Notes  °[facility] [address], [place] [phone] Also accepts self-pay patients.  °[facility] [address], [place] ° ([phone]) [phone]   °[facility] [address], [place] ([phone]) [phone]   °[facility] [address] [place] [address], [place] [phone]) [phone]   °[facility] [address], [place]  ° [phone]) [phone] Only accepts [place] Access Medicaid patients after they have their name applied to their card.  ° °Self-Pay (no insurance) in [place]: ° °Organization         Address  Phone   Notes  °Sickle Cell Patients, [facility] [address], [place] ([phone]) [phone]   °[facility] [address], [place] ([phone]) [phone]   ° [facility] ° [address], [place] ([phone]) [phone]   °[facility]/Dr. Osei-Bonsu ° [address] [place] [address], [place] or [address], [place] [phone] Phone number for both [place] and [place] locations is the same.  °[facility] [address], [place] ([phone]   °[facility] [address] [place] [address], [place] or [address] ([phone]) [phone] °([phone]) [phone]   °[facility] [address], [place] ([phone]) [phone], phone; ([phone], fax Sees patients 1st and 3rd Saturday of every month.  Must not qualify for public or private insurance (i.e. Medicaid, Medicare, [employer] Choice, Veterans' Benefits) • Household  income should be no more than 200% of the poverty level •The clinic cannot treat you if you are pregnant or think you are pregnant • Sexually transmitted diseases are not treated at the clinic.  ° ° ° ° ° °

## 2014-05-25 NOTE — ED Provider Notes (Signed)
Medical screening examination/treatment/procedure(s) were performed by non-physician practitioner and as supervising physician I was immediately available for consultation/collaboration.   EKG Interpretation None        Macaulay Reicher T Simone Rodenbeck, MD 05/25/14 1721 

## 2014-05-25 NOTE — ED Provider Notes (Signed)
CSN: [id number]     Arrival date & time 05/25/14  1538 History   First MD Initiated Contact with Patient 05/25/14 1549     Chief Complaint  Patient presents with  . Knee Pain     (Consider location/radiation/quality/duration/timing/severity/associated sxs/prior Treatment) Patient is a 43 y.o. male presenting with knee pain. The history is provided by the patient. No language interpreter was used.  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 He reports pain is uncontrolled at present. He has an appointment with Pain Management pending but is currently out of pain medicines.    Past Medical History  Diagnosis Date  . Arthritis   . Neuromuscular disorder    Past Surgical History  Procedure Laterality Date  . Joint replacement    . Spine surgery     Family History  Problem Relation Age of Onset  . Hyperlipidemia Mother    History  Substance Use Topics  . Smoking status: Never Smoker   . Smokeless tobacco: Current User    Types: Snuff  . Alcohol Use: No    Review of Systems  Constitutional: Negative for fever and chills.  Musculoskeletal: Negative.        See HPI  Skin: Negative.   Neurological: Negative.       Allergies  Penicillins and Sulfa antibiotics  Home Medications   Prior to Admission medications   Medication Sig Start Date End Date Taking? Authorizing Provider  diazepam (VALIUM) 10 MG tablet Take 1 tablet (10 mg total) by mouth at bedtime as needed for anxiety. 05/18/14   Carmelina DaneJeffery S Anderson, MD  morphine (MS CONTIN) 15 MG 12 hr tablet Take 1 tablet (15 mg total) by mouth every 12 (twelve) hours. 05/18/14   Carmelina DaneJeffery S Anderson, MD  oxycodone (ROXICODONE) 30 MG immediate release tablet Take 1 tablet (30 mg total) by mouth every 6 (six) hours as needed for pain.  05/18/14   Carmelina DaneJeffery S Anderson, MD   BP 157/106  Pulse 78  Temp(Src) 97.6 F (36.4 C) (Oral)  Resp 16  SpO2 100% Physical Exam  Constitutional: He is oriented to person, place, and time. He appears well-developed and well-nourished. No distress.  Pulmonary/Chest: Effort normal.  Musculoskeletal: Normal range of motion.  Right knee has multiple well healed surgical scars. Moderate effusion. No redness or warmth. FROM. No calf or thigh tenderness.   Neurological: He is alert and oriented to person, place, and time.  Skin: Skin is warm and dry.    ED Course  Procedures (including critical care time) Labs Review Labs Reviewed - No data to display  Imaging Review No results found.   EKG Interpretation None      MDM   Final diagnoses:  None    1. Chronic knee pain  Pain management provided. Discussed importance of Pain Management follow up as planned.    Arnoldo HookerShari A Alcides Nutting, PA-C 05/25/14 1714

## 2014-05-25 NOTE — ED Notes (Signed)
Pt amb to triage with quick steady gait in nad. Pt states "I was kicked out of my pain clinic, and I am withdrawing. I need some pain medicine!" pt states he has apt at another pain clinic "sometime next month" pt is not sure of date, "Dr. Madie RenoIrving".

## 2014-10-04 ENCOUNTER — Ambulatory Visit (INDEPENDENT_AMBULATORY_CARE_PROVIDER_SITE_OTHER): Payer: BLUE CROSS/BLUE SHIELD | Admitting: Internal Medicine

## 2014-10-04 VITALS — BP 130/90 | HR 78 | Temp 98.8°F | Resp 16 | Ht 71.0 in | Wt 250.0 lb

## 2014-10-04 DIAGNOSIS — Z9889 Other specified postprocedural states: Secondary | ICD-10-CM

## 2014-10-04 DIAGNOSIS — M2391 Unspecified internal derangement of right knee: Secondary | ICD-10-CM

## 2014-10-04 DIAGNOSIS — G894 Chronic pain syndrome: Secondary | ICD-10-CM

## 2014-10-04 MED ORDER — MORPHINE SULFATE ER 15 MG PO TBCR: 15.0000 mg | EXTENDED_RELEASE_TABLET | Freq: Two times a day (BID) | ORAL | Status: DC

## 2014-10-04 MED ORDER — OXYCODONE HCL 15 MG PO TABS: 15.0000 mg | ORAL_TABLET | ORAL | Status: DC | PRN

## 2014-10-04 NOTE — Progress Notes (Signed)
Subjective:  This chart was scribed for Arthur Sia, MD by Carl Best, Medical Scribe. This patient was seen in Room 4 and the patient's care was started at 10:26 AM.   Patient ID: Arthur Alvarez, male    DOB: 1970-11-27, 44 y.o.   MRN: [id number]  HPI HPI Comments: Arthur Alvarez is a 44 y.o. male with a history of chronic pain in his knees bilaterally who presents to the [facility] for a referral to a pain clinic.  He was seen here in the past by Dr. Alwyn Ren and Dr. Dareen Piano.  He was referred to [facility] but states that the practice is more geared towards family practice rather than pain management.  He was advised by a physician at [facility] to get a nuclear stress test but was unable to do the testing because he could not afford it.  He was also placed on blood pressure medication but experienced blurred vision and dizziness while on the medication.  He confronted the physician about his symptoms and was advised to cut his pain medication dosage in half. He has been unable to maintain this schedule with any appreciative pain relief that allows his continued activity at work to be done successfully.  He started his pain management through [facility] rehabilitation many years ago-Dr Wynn Banker 2005-07. He  then was seen at [facility] for several years but was dismissed from the practice during the fall of 2015 after he was two pills short.    His orthopedist was Dr. Prince Rome and his last knee surgery was done by Dr. Gerilyn Nestle orthopedics.  He has not seen Dr. August Saucer recently.  He does not wear braces on his knees due to discomfort.  He has a history of 3 cadaver ACLs on R and left knee surgeries.  Also lumbar surgery.  Past Medical History  Diagnosis Date  . Arthritis   . Neuromuscular disorder    Past Surgical History  Procedure Laterality Date  . Joint replacement    . Spine surgery--- 2003 Dr. Trey Sailors     Family History  Problem Relation Age of  Onset  . Hyperlipidemia Mother    History   Social History  . Marital Status: Married    Spouse Name: N/A  . Number of Children: N/A  . Years of Education: N/A   Occupational History  . Not on file.   Social History Main Topics  . Smoking status: Never Smoker   . Smokeless tobacco: Current User    Types: Snuff  . Alcohol Use: No  . Drug Use: No  . Sexual Activity: Not on file   Other Topics Concern  . Not on file   Social History Narrative   Allergies  Allergen Reactions  . Penicillins   . Sulfa Antibiotics     Review of Systems No fever chills or night sweats No chest pain or palpitations No current depression or anxiety or suicidal ideation other than being appropriately stressed over his chronic pain situation  Objective:  Physical Exam  Constitutional: He is oriented to person, place, and time. He appears well-developed and well-nourished.  Overweight  HENT:  Head: Normocephalic and atraumatic.  Eyes: EOM are normal.  Neck: Normal range of motion.  Cardiovascular: Normal rate.   Pulmonary/Chest: Effort normal.  Musculoskeletal: Normal range of motion.  There are multiple scars on the right knee in it is unstable to stressors. He has a valgus gait with this extremity which is extremely  abnormal. There is pain with range of motion. The left knee also shows signs of operation with a mild chronic swelling and stiffness with range of motion that creates pain  Neurological: He is alert and oriented to person, place, and time.  Skin: Skin is warm and dry.  Psychiatric: He has a normal mood and affect. His behavior is normal.  Nursing note and vitals reviewed.   BP 130/90 mmHg  Pulse 78  Temp(Src) 98.8 F (37.1 C) (Oral)  Resp 16  Ht 5\' 11"  (1.803 m)  Wt 250 lb (113.399 kg)  BMI 34.88 kg/m2  SpO2 100% Assessment & Plan:   I have completed the patient encounter in its entirety as documented by the scribe, with editing by me where necessary. Veronica Guerrant P.  Merla Riches, M.D.  Chronic pain syndrome - Plan: Ambulatory referral to Pain Clinic  Internal derangement of right knee  History of right knee surgery 3/left knee surgery 1  He will very likely need a knee replacement in the near future His current use of pain medication to control the pain generated by an unstable right knee will micros sooner rather than later without some sort of stabilizer. He needs chronic pain therapy and so will be given a one-month supply of medication and referral to a new pain resource I'll also refer him to orthopedic surgery [facility] feeling that he needs some type of stabilization of this knee  if he is going to continue to work at his physical job. Plans should also be made for the appropriate time for joint replacement.  His medication use was researched through the [facility] and found to be consistent with his story  Meds ordered this encounter  Medications  . oxyCODONE (ROXICODONE) 15 MG immediate release tablet    Sig: Take 1 tablet (15 mg total) by mouth every 4 (four) hours as needed for pain. One month supply    Dispense:  120 tablet    Refill:  0  . morphine (MS CONTIN) 15 MG 12 hr tablet    Sig: Take 1 tablet (15 mg total) by mouth every 12 (twelve) hours. For 10/08/14 or after    Dispense:  60 tablet    Refill:  0

## 2014-10-29 ENCOUNTER — Ambulatory Visit (INDEPENDENT_AMBULATORY_CARE_PROVIDER_SITE_OTHER): Payer: BLUE CROSS/BLUE SHIELD | Admitting: Family Medicine

## 2014-10-29 VITALS — BP 148/88 | HR 104 | Temp 99.2°F | Resp 12 | Ht 70.0 in | Wt 246.1 lb

## 2014-10-29 DIAGNOSIS — G894 Chronic pain syndrome: Secondary | ICD-10-CM

## 2014-10-29 MED ORDER — OXYCODONE HCL 15 MG PO TABS: 15.0000 mg | ORAL_TABLET | Freq: Three times a day (TID) | ORAL | Status: DC | PRN

## 2014-10-29 MED ORDER — MORPHINE SULFATE ER 15 MG PO TBCR: 15.0000 mg | EXTENDED_RELEASE_TABLET | Freq: Two times a day (BID) | ORAL | Status: DC

## 2014-10-29 NOTE — Progress Notes (Signed)
10/30/2014 at 9:06 AM  Arthur Alvarez / DOB: 04/14/1971 / MRN: [id number]  The patient has Chronic pain syndrome; Opioid type dependence, continuous; and Internal derangement of right knee on his problem list.  SUBJECTIVE  Chief compalaint: Medication Refill   History of present illness: Arthur Alvarez is 44 y.o. well appearing male presenting for an opioid medication refill.  He saw Dr. Merla Richesoolittle in February on 2/16 and was prescribed his current medications and was given a referral to the [facility]. He reports he was given a number to the clinic but the number did not work.  He did not contact [facility] after learning the number did not work.  He thought Dr. Merla Richesoolittle would be here today to refill his medications.     He  has a past medical history of Arthritis and Neuromuscular disorder.    He has a current medication list which includes the following prescription(s): morphine and oxycodone.  Arthur Alvarez is allergic to penicillins and sulfa antibiotics. He  reports that he has never smoked. His smokeless tobacco use includes Snuff. He reports that he does not drink alcohol or use illicit drugs. He  has no sexual activity history on file. The patient  has past surgical history that includes Joint replacement and Spine surgery.  His family history includes Hyperlipidemia in his mother.  Review of Systems  Constitutional: Negative.   HENT: Negative.   Eyes: Negative.   Respiratory: Negative.   Cardiovascular: Negative.   Gastrointestinal: Negative.   Genitourinary: Negative.   Musculoskeletal: Positive for joint pain.  Skin: Negative.   Neurological: Negative.   Endo/Heme/Allergies: Negative.   Psychiatric/Behavioral: Negative.     OBJECTIVE  His  height is 5\' 10"  (1.778 m) and weight is 246 lb 2 oz (111.642 kg). His oral temperature is 99.2 F (37.3 C). His blood pressure is 148/88 and his pulse is 104. His respiration is 12 and oxygen saturation is 93%.  The patient's body mass  index is 35.32 kg/(m^2).  Physical Exam  Constitutional: He is oriented to person, place, and time. He appears well-developed and well-nourished. No distress.  Eyes: Conjunctivae and EOM are normal. Pupils are equal, round, and reactive to light. Right eye exhibits no discharge. Left eye exhibits no discharge.  Cardiovascular: Regular rhythm, normal heart sounds and normal pulses.   No extrasystoles are present. Tachycardia present.   Respiratory: Effort normal and breath sounds normal.  Neurological: He is alert and oriented to person, place, and time. No cranial nerve deficit.  Skin: Skin is warm and dry. He is not diaphoretic.  Psychiatric: He has a normal mood and affect. His behavior is normal. Judgment and thought content normal.    No results found for this or any previous visit (from the past 24 hour(s)).  ASSESSMENT & PLAN  Arthur Alvarez was seen today for medication refill.  Diagnoses and all orders for this visit:  Chronic pain syndrome: Patient is a chronic opioid user and is here today for a medication refill. [facility] checked and the most recent prescriptions he has received were from Dr. Merla Richesoolittle, and no other suspicious activity is noted.  Per chart review, documentation shows that patient was given number to pain clinic along with referral from Dr. Merla Richesoolittle, and was asked to call CPS to set up his intake appointment, and the number did not work.  He was referred again today and the number was confirmed by me and this info was relayed to patient via AVS.  He is  to call.  I have given him enough medicine for 8 days, at which time he was asked to come back and see Dr. Merla Riches for further management of this problem.   Orders: -     morphine (MS CONTIN) 15 MG 12 hr tablet; Take 1 tablet (15 mg total) by mouth every 12 (twelve) hours. For 10/08/14 or after -     oxyCODONE (ROXICODONE) 15 MG immediate release tablet; Take 1 tablet (15 mg total) by mouth every 8 (eight) hours as needed  for pain. -     Ambulatory referral to Pain Clinic    The patient was advised to call or come back to clinic if he does not see an improvement in symptoms, or worsens with the above plan.   Deliah Boston, MHS, PA-C [facility] 10/30/2014 9:06 AM

## 2014-10-29 NOTE — Patient Instructions (Signed)
You have been referred to [place].  The confirmed telephone number is [phone].

## 2014-11-07 ENCOUNTER — Ambulatory Visit (INDEPENDENT_AMBULATORY_CARE_PROVIDER_SITE_OTHER): Payer: BLUE CROSS/BLUE SHIELD | Admitting: Internal Medicine

## 2014-11-07 VITALS — BP 160/98 | HR 83 | Temp 98.3°F | Resp 12 | Ht 70.0 in | Wt 250.0 lb

## 2014-11-07 DIAGNOSIS — M2391 Unspecified internal derangement of right knee: Secondary | ICD-10-CM | POA: Diagnosis not present

## 2014-11-07 DIAGNOSIS — G894 Chronic pain syndrome: Secondary | ICD-10-CM

## 2014-11-07 DIAGNOSIS — F1120 Opioid dependence, uncomplicated: Secondary | ICD-10-CM

## 2014-11-07 MED ORDER — OXYCODONE HCL 15 MG PO TABS: 15.0000 mg | ORAL_TABLET | Freq: Three times a day (TID) | ORAL | Status: DC | PRN

## 2014-11-07 MED ORDER — MORPHINE SULFATE ER 15 MG PO TBCR: 15.0000 mg | EXTENDED_RELEASE_TABLET | Freq: Two times a day (BID) | ORAL | Status: DC

## 2014-11-07 MED ORDER — OXYCODONE HCL 15 MG PO TABS: 15.0000 mg | ORAL_TABLET | ORAL | Status: DC | PRN

## 2014-11-07 NOTE — Progress Notes (Signed)
Subjective:    Patient ID: Arthur Alvarez, male    DOB: 08-Jan-1971, 44 y.o.   MRN: [id number] This chart was scribed for [name], MD by Jolene Provost, Medical Scribe. This patient was seen in Room 11 and the patient's care was started a 8:11 PM.  Chief Complaint  Patient presents with  . Medication Refill    MS Contin & Oxycodone    HPI HPI Comments: Arthur Alvarez is a 44 y.o. male who presents to [facility] reporting for a medication refill. Patient Active Problem List   Diagnosis Date Noted  . Internal derangement of right knee 10/04/2014  . Chronic pain syndrome 05/17/2014  . Opioid type dependence, continuous 05/17/2014   we have arranged orthopedic evaluation at [facility] later this month and referral to comprehensive pain management . He is filling out their information prior to the initial visit expected in 30 days.    Review of Systems    noncontributory Objective:   Physical Exam  Constitutional: He is oriented to person, place, and time. He appears well-developed and well-nourished. No distress.  HENT:  Head: Normocephalic.  Eyes: EOM are normal. Pupils are equal, round, and reactive to light.  Neck: Neck supple.  Cardiovascular: Normal rate.   Pulmonary/Chest: Effort normal.  Musculoskeletal:  No change from past exam  Neurological: He is alert and oriented to person, place, and time. Coordination normal.  Skin: He is not diaphoretic.  Psychiatric: He has a normal mood and affect. His behavior is normal.  Nursing note and vitals reviewed.      Assessment & Plan:    I have completed the patient encounter in its entirety as documented by the scribe, with editing by me where necessary. Purva Vessell P. Merla Riches, M.D. Chronic pain syndrome - Plan: DISCONTINUED: oxyCODONE (ROXICODONE) 15 MG immediate release tablet, DISCONTINUED: morphine (MS CONTIN) 15 MG 12 hr tablet  Opioid type dependence, continuous  Internal derangement of right knee  Meds  ordered this encounter  Medications  . morphine (MS CONTIN) 15 MG 12 hr tablet    Sig: Take 1 tablet (15 mg total) by mouth every 12 (twelve) hours.    Dispense:  60 tablet    Refill:  0  . oxyCODONE (ROXICODONE) 15 MG immediate release tablet    Sig: Take 1 tablet (15 mg total) by mouth every 4 (four) hours as needed for pain. One month supply    Dispense:  120 tablet    Refill:  0

## 2014-11-12 NOTE — Progress Notes (Signed)
History and physical examinations reviewed with PA Clark.  Agree with assessment and plan.  Tejasvi Brissett Martin Ishan Sanroman, M.D. [facility]  [place] [address] [place],   [address] [phone] phone [phone] fax  

## 2014-12-03 ENCOUNTER — Ambulatory Visit (INDEPENDENT_AMBULATORY_CARE_PROVIDER_SITE_OTHER): Payer: BLUE CROSS/BLUE SHIELD | Admitting: Internal Medicine

## 2014-12-03 VITALS — BP 132/82 | HR 87 | Temp 98.4°F | Resp 18 | Ht 70.75 in | Wt 251.1 lb

## 2014-12-03 DIAGNOSIS — M2391 Unspecified internal derangement of right knee: Secondary | ICD-10-CM | POA: Diagnosis not present

## 2014-12-03 DIAGNOSIS — F1120 Opioid dependence, uncomplicated: Secondary | ICD-10-CM

## 2014-12-03 DIAGNOSIS — G894 Chronic pain syndrome: Secondary | ICD-10-CM

## 2014-12-03 MED ORDER — MORPHINE SULFATE ER 15 MG PO TBCR: 15.0000 mg | EXTENDED_RELEASE_TABLET | Freq: Two times a day (BID) | ORAL | Status: DC

## 2014-12-03 MED ORDER — OXYCODONE HCL 15 MG PO TABS: 15.0000 mg | ORAL_TABLET | ORAL | Status: DC | PRN

## 2014-12-03 NOTE — Progress Notes (Signed)
Subjective:  This chart was scribed for [name] MD,  by Veverly Fells, at [facility].  This patient was seen in room 11 and the patient's care was started at 1:56 PM.    Patient ID: Arthur Alvarez, male    DOB: [birth year], 44 y.o.   MRN: [id number] Chief Complaint  Patient presents with  . Medication Refill    Discuss Pain Management Referral   this patient is relatively new to our practice as he moved here to work  HPI  HPI Comments: Arthur Alvarez is a 44 y.o. male with a history of chronic knee pain status post several surgeries who presents to [facility] for medication refill. Note that at last visit 1 month ago he was being referred to pain management again. He has been involved in pain managed for many years at several different places and is now interested in a new situation. [facility].   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 He said this was his prior dose. We do not have his old records and so I told him we would not do this.   His pain management referral to [facility] was awarded because they do not take his insurance.  We also had referred him to [facility] for orthopedic consultation to see if there is not a surgical solution for his right knee. He has been operated on several times before. Unfortunately he could not find the office and missed Appointment.   He has no other complaints today. He has not followed up to establish PCP in our practice.  It is his busiest time at work.  Past Medical History  Diagnosis Date  . Arthritis   . Neuromuscular disorder     Current Outpatient Prescriptions on File Prior to Visit  Medication Sig Dispense Refill  . morphine (MS CONTIN) 15 MG 12 hr tablet Take 1 tablet (15 mg total) by mouth every 12 (twelve) hours. 60 tablet 0    . oxyCODONE (ROXICODONE) 15 MG immediate release tablet Take 1 tablet (15 mg total) by mouth every 4 (four) hours as needed for pain. One month supply 120 tablet 0   No current facility-administered medications on file prior to visit.    Allergies  Allergen Reactions  . Penicillins   . Sulfa Antibiotics         Review of Systems  Constitutional: Negative for fever and chills.  HENT: Negative for drooling and nosebleeds.   Eyes: Negative for discharge and redness.  Gastrointestinal: Negative for nausea and vomiting.       Objective:   Physical Exam  Constitutional: He appears well-developed and well-nourished. No distress.  HENT:  Head: Normocephalic and atraumatic.  Eyes: Pupils are equal, round, and reactive to light. Right eye exhibits no discharge. Left eye exhibits no discharge.  Pulmonary/Chest: Effort normal. No respiratory distress.  Neurological: He is alert. Coordination normal.  Skin: No rash noted. He is not diaphoretic.  Psychiatric: He has a normal mood and affect. His behavior is normal.  Nursing note and vitals reviewed.   BP 132/82 mmHg  Pulse 87  Temp(Src) 98.4 F (36.9 C) (Oral)  Resp 18  Ht 5' 10.75" (1.797 m)  Wt 251 lb 2 oz (113.91 kg)  BMI 35.27 kg/m2  SpO2 99%         Assessment & Plan:  I have completed the patient encounter in its entirety as documented by the scribe, with editing by me where necessary. Kateland Leisinger P. Merla Riches, M.D.  Chronic pain syndrome  Opioid type dependence, continuous  Internal derangement of right knee  Meds ordered this encounter  Medications  . oxyCODONE (ROXICODONE) 15 MG immediate release tablet    Sig: Take 1 tablet (15 mg total) by mouth every 4 (four) hours as needed for pain. One month supply-for 12/07/14 or after    Dispense:  120 tablet    Refill:  0  . morphine (MS CONTIN) 15 MG 12 hr tablet    Sig: Take 1 tablet (15 mg total) by mouth every 12 (twelve) hours. For 12/07/14 or after    Dispense:   60 tablet    Refill:  0   We will continue trying to establish orthopedic evaluation and chronic pain management referral He will arranged to have his past records for the orthopedist in the pain management center. [place] controlled substances reporting system reveals no problems with his compliance

## 2014-12-05 NOTE — Addendum Note (Signed)
Addended by: Tonye PearsonOLITTLE, ROBERT P on: 12/05/2014 10:48 PM   Modules accepted: Level of Service

## 2014-12-27 ENCOUNTER — Ambulatory Visit (INDEPENDENT_AMBULATORY_CARE_PROVIDER_SITE_OTHER): Payer: BLUE CROSS/BLUE SHIELD | Admitting: Internal Medicine

## 2014-12-27 VITALS — BP 146/90 | HR 109 | Temp 99.1°F | Resp 16 | Wt 249.0 lb

## 2014-12-27 DIAGNOSIS — R030 Elevated blood-pressure reading, without diagnosis of hypertension: Secondary | ICD-10-CM

## 2014-12-27 DIAGNOSIS — IMO0001 Reserved for inherently not codable concepts without codable children: Secondary | ICD-10-CM | POA: Insufficient documentation

## 2014-12-27 DIAGNOSIS — M2391 Unspecified internal derangement of right knee: Secondary | ICD-10-CM

## 2014-12-27 DIAGNOSIS — F1120 Opioid dependence, uncomplicated: Secondary | ICD-10-CM

## 2014-12-27 DIAGNOSIS — Z6834 Body mass index (BMI) 34.0-34.9, adult: Secondary | ICD-10-CM

## 2014-12-27 DIAGNOSIS — G894 Chronic pain syndrome: Secondary | ICD-10-CM

## 2014-12-27 MED ORDER — MORPHINE SULFATE ER 15 MG PO TBCR: 15.0000 mg | EXTENDED_RELEASE_TABLET | Freq: Two times a day (BID) | ORAL | Status: DC

## 2014-12-27 MED ORDER — OXYCODONE HCL 15 MG PO TABS: 15.0000 mg | ORAL_TABLET | ORAL | Status: DC | PRN

## 2014-12-27 NOTE — Progress Notes (Signed)
Subjective:    Patient ID: Arthur Alvarez, male    DOB: 1970-09-14, 44 y.o.   MRN: [id number] This chart was scribed for [name], MD by Jolene Provost, Medical Scribe. This patient was seen in Room 1 and the patient's care was started a 6:34 PM.  Chief Complaint  Patient presents with  . Medication Refill    MS contin, Oxycodone  see 10/04/14 for full summary  HPI HPI Comments: Arthur Alvarez is a 44 y.o. male who presents to [facility] reporting for a medication refill.  Pt states he did not report to his orthopedics appointment because he was having a very difficult week with a death in the family and not having insurance due to recently being laid off. He understands that he is responsible for stab wishing this follow-up with Dr. Lamar Sprinkles at [facility].   He has done well this last month with medication and has had more mobility with less pain. He has stayed on his regular schedule.   Pt states he has a new job and will get insurance again in one/2 month, and at that point he will report to orthopedics. This new job will be in [place] where we will try to establish chronic pain management as well as.  He still has not had a recent comprehensive physical examination and is waiting on insurance coverage for this as well His obesity and his borderline blood pressure results need further evaluation  Review of Systems  Constitutional: Negative for fever, chills and unexpected weight change.  Eyes: Negative for visual disturbance.  Respiratory: Negative for chest tightness and shortness of breath.   Cardiovascular: Negative for chest pain, palpitations and leg swelling.  Musculoskeletal:       Knee pain  Neurological: Negative for headaches.  Psychiatric/Behavioral: Negative for behavioral problems, sleep disturbance and dysphoric mood.       Objective:   Physical Exam  Constitutional: He is oriented to person, place, and time. He appears well-developed and well-nourished. No  distress.  HENT:  Head: Normocephalic and atraumatic.  Eyes: EOM are normal. Pupils are equal, round, and reactive to light.  Neck: Neck supple.  Cardiovascular: Normal rate.   Pulmonary/Chest: Effort normal. No respiratory distress.  Musculoskeletal: Normal range of motion.  Both knees are swollen and his gait is guarded with a slightly up favoring the right  Neurological: He is alert and oriented to person, place, and time. No cranial nerve deficit. Coordination normal.  Skin: Skin is warm and dry. He is not diaphoretic.  Psychiatric: He has a normal mood and affect. His behavior is normal. Thought content normal.  Nursing note and vitals reviewed.      Assessment & Plan:  Chronic pain syndrome--secondary to Internal derangement of right knee and left knee status post surgeries  Opioid type dependence, continuous due to chronic pain needs  BMI 34.0-34.9,adult  Elevated BP --This needs primary care follow-up with  regular preventive medicine exam--he reports normal lipid studies in the past He also reports only borderline blood pressure never being required for treatment in the past  Status post lumbar surgery   Meds ordered this encounter  Medications  . oxyCODONE (ROXICODONE) 15 MG immediate release tablet    Sig: Take 1 tablet (15 mg total) by mouth every 4 (four) hours as needed for pain. One month supply-for 01/06/15 or after    Dispense:  120 tablet    Refill:  0  . morphine (MS CONTIN) 15 MG 12 hr tablet  Sig: Take 1 tablet (15 mg total) by mouth every 12 (twelve) hours. For 01/06/15 or after    Dispense:  60 tablet    Refill:  0  F/u 02/06/15   I have completed the patient encounter in its entirety as documented by the scribe, with editing by me where necessary. Anacleto Batterman P. Merla Riches, M.D.

## 2014-12-27 NOTE — Patient Instructions (Signed)
Recheck Monday, June 20 Orthopedic surgery at [facility]-Dr. Margie BilletJason Lang

## 2015-02-06 ENCOUNTER — Ambulatory Visit (INDEPENDENT_AMBULATORY_CARE_PROVIDER_SITE_OTHER): Payer: Self-pay | Admitting: Internal Medicine

## 2015-02-06 VITALS — BP 128/80 | HR 81 | Temp 99.1°F | Resp 18 | Ht 71.0 in | Wt 247.0 lb

## 2015-02-06 DIAGNOSIS — M2391 Unspecified internal derangement of right knee: Secondary | ICD-10-CM

## 2015-02-06 DIAGNOSIS — G894 Chronic pain syndrome: Secondary | ICD-10-CM

## 2015-02-06 MED ORDER — MORPHINE SULFATE ER 15 MG PO TBCR: 15.0000 mg | EXTENDED_RELEASE_TABLET | Freq: Two times a day (BID) | ORAL | Status: DC

## 2015-02-06 MED ORDER — OXYCODONE HCL 15 MG PO TABS: 15.0000 mg | ORAL_TABLET | ORAL | Status: DC | PRN

## 2015-02-06 NOTE — Progress Notes (Signed)
Subjective:  This chart was scribed for [name] MD, by Veverly Fells, at [facility].  This patient was seen in room 14 and the patient's care was started at .    Patient ID: Arthur Alvarez, male    DOB: 1970/12/17, 44 y.o.   MRN: [id number] Chief Complaint  Patient presents with  . Medication Refill    morphine and oxycodone    HPI  HPI Comments: Arthur Alvarez is a 44 y.o. male who presents to [facility] for medication refill.  He has been having increased knee pains this past month compared to prior months. His weight fluctuates around (between 150-130) and is unsure if he has gained or lost any weight. He has no other complaints today.   Work: He is working for [employer] for [employer] and is enjoying his job/finds it interesting.      Patient Active Problem List   Diagnosis Date Noted  . BMI 34.0-34.9,adult 12/27/2014  . Elevated BP 12/27/2014  . Internal derangement of right knee 10/04/2014  . Chronic pain syndrome 05/17/2014  . Opioid type dependence, continuous 05/17/2014   Past Medical History  Diagnosis Date  . Arthritis   . Neuromuscular disorder    Past Surgical History  Procedure Laterality Date  . Joint replacement    . Spine surgery     Allergies  Allergen Reactions  . Penicillins   . Sulfa Antibiotics    Prior to Admission medications   Medication Sig Start Date End Date Taking? Authorizing Provider  morphine (MS CONTIN) 15 MG 12 hr tablet Take 1 tablet (15 mg total) by mouth every 12 (twelve) hours. For 01/06/15 or after 12/27/14  Yes Tonye Pearson, MD  oxyCODONE (ROXICODONE) 15 MG immediate release tablet Take 1 tablet (15 mg total) by mouth every 4 (four) hours as needed for pain. One month supply-for 01/06/15 or after 12/27/14  Yes Tonye Pearson, MD   History   Social History  . Marital Status: Married    Spouse Name: N/A  . Number of Children: N/A  . Years of  Education: N/A   Occupational History  . Not on file.   Social History Main Topics  . Smoking status: Never Smoker   . Smokeless tobacco: Current User    Types: Snuff  . Alcohol Use: No  . Drug Use: No  . Sexual Activity: Not on file   Other Topics Concern  . Not on file   Social History Narrative     Current Outpatient Prescriptions on File Prior to Visit  Medication Sig Dispense Refill  . morphine (MS CONTIN) 15 MG 12 hr tablet Take 1 tablet (15 mg total) by mouth every 12 (twelve) hours. For 01/06/15 or after 60 tablet 0  . oxyCODONE (ROXICODONE) 15 MG immediate release tablet Take 1 tablet (15 mg total) by mouth every 4 (four) hours as needed for pain. One month supply-for 01/06/15 or after 120 tablet 0   No current facility-administered medications on file prior to visit.    Allergies  Allergen Reactions  . Penicillins   . Sulfa Antibiotics        Review of Systems  Constitutional: Negative for fever and chills.  Gastrointestinal: Negative for nausea and vomiting.  Musculoskeletal: Positive for arthralgias. Negative for gait problem, neck pain and neck stiffness.       Objective:   Physical Exam  Constitutional: He is oriented to person, place, and  time. He appears well-developed and well-nourished. No distress.  HENT:  Head: Normocephalic and atraumatic.  Eyes: Conjunctivae and EOM are normal. Pupils are equal, round, and reactive to light.  Neck: Neck supple.  Cardiovascular: Normal rate.   Pulmonary/Chest: Effort normal. No respiratory distress.  Musculoskeletal: Normal range of motion.  Neurological: He is alert and oriented to person, place, and time.  Skin: Skin is warm and dry.  Psychiatric: He has a normal mood and affect. His behavior is normal.  Nursing note and vitals reviewed.   Abnormal gait due to instability right knee as noted in past  Filed Vitals:   02/06/15 0941  BP: 128/80  Pulse: 81  Temp: 99.1 F (37.3 C)  TempSrc: Oral    Resp: 18  Height: 5\' 11"  (1.803 m)  Weight: 247 lb (112.038 kg)  SpO2: 98%      Assessment & Plan:  I have completed the patient encounter in its entirety as documented by the scribe, with editing by me where necessary. Matea Stanard P. Merla Riches, M.D.  Chronic pain syndrome  Internal derangement of right knee  Meds ordered this encounter  Medications  . morphine (MS CONTIN) 15 MG 12 hr tablet    Sig: Take 1 tablet (15 mg total) by mouth every 12 (twelve) hours. For 02/06/15 or after    Dispense:  60 tablet    Refill:  0  . oxyCODONE (ROXICODONE) 15 MG immediate release tablet    Sig: Take 1 tablet (15 mg total) by mouth every 4 (four) hours as needed for pain. One month supply-for 02/06/15 or after    Dispense:  120 tablet    Refill:  0  . oxyCODONE (ROXICODONE) 15 MG immediate release tablet    Sig: Take 1 tablet (15 mg total) by mouth every 4 (four) hours as needed for pain. One month supply-for 03/08/15 or after    Dispense:  120 tablet    Refill:  0  . morphine (MS CONTIN) 15 MG 12 hr tablet    Sig: Take 1 tablet (15 mg total) by mouth every 12 (twelve) hours. For 03/08/15 or after    Dispense:  60 tablet    Refill:  0   When he has insurance with his new position we will establish full reevaluation at [facility] in hopes that there is a surgical procedure that can stabilize his total knee dysfunction Follow-up 2 months

## 2015-04-04 ENCOUNTER — Ambulatory Visit (INDEPENDENT_AMBULATORY_CARE_PROVIDER_SITE_OTHER): Payer: Self-pay | Admitting: Internal Medicine

## 2015-04-04 VITALS — BP 118/78 | HR 76 | Temp 98.7°F | Resp 18 | Ht 71.0 in | Wt 239.0 lb

## 2015-04-04 DIAGNOSIS — IMO0001 Reserved for inherently not codable concepts without codable children: Secondary | ICD-10-CM

## 2015-04-04 DIAGNOSIS — G894 Chronic pain syndrome: Secondary | ICD-10-CM

## 2015-04-04 DIAGNOSIS — M2391 Unspecified internal derangement of right knee: Secondary | ICD-10-CM

## 2015-04-04 DIAGNOSIS — R030 Elevated blood-pressure reading, without diagnosis of hypertension: Secondary | ICD-10-CM

## 2015-04-04 MED ORDER — OXYCODONE HCL 15 MG PO TABS: 15.0000 mg | ORAL_TABLET | ORAL | Status: DC | PRN

## 2015-04-04 MED ORDER — MORPHINE SULFATE ER 15 MG PO TBCR: 15.0000 mg | EXTENDED_RELEASE_TABLET | Freq: Two times a day (BID) | ORAL | Status: DC

## 2015-04-04 NOTE — Progress Notes (Signed)
Subjective:  This chart was scribed for Arthur Sia, MD by [facility], medical scribe at [facility].The patient was seen in exam room 10 and the patient's care was started at 5:26 PM.   Patient ID: Arthur Alvarez, male    DOB: 01-09-1971, 44 y.o.   MRN: [id number] Chief Complaint  Patient presents with  . Medication Refill    Morphine and Oxycodone   HPI HPI Comments: Arthur Alvarez is a 44 y.o. male who presents to [facility] for a medication refill. Pain has been up the past couple months because of work. Aircraft maintenance-likes job. Still does not have insurance, he will wait for a referral until he gets insurance through work. Family stable On schedule NCCSRS normal   Past Medical History  Diagnosis Date  . Arthritis   . Neuromuscular disorder    Prior to Admission medications   Medication Sig Start Date End Date Taking? Authorizing Provider  morphine (MS CONTIN) 15 MG 12 hr tablet Take 1 tablet (15 mg total) by mouth every 12 (twelve) hours. For 02/06/15 or after 02/06/15  Yes Tonye Pearson, MD  morphine (MS CONTIN) 15 MG 12 hr tablet Take 1 tablet (15 mg total) by mouth every 12 (twelve) hours. For 03/08/15 or after 02/06/15  Yes Tonye Pearson, MD  oxyCODONE (ROXICODONE) 15 MG immediate release tablet Take 1 tablet (15 mg total) by mouth every 4 (four) hours as needed for pain. One month supply-for 02/06/15 or after 02/06/15  Yes Tonye Pearson, MD  oxyCODONE (ROXICODONE) 15 MG immediate release tablet Take 1 tablet (15 mg total) by mouth every 4 (four) hours as needed for pain. One month supply-for 03/08/15 or after 02/06/15  Yes Tonye Pearson, MD   Allergies  Allergen Reactions  . Penicillins   . Sulfa Antibiotics    Review of Systems  Musculoskeletal: Positive for arthralgias.       Objective:  BP 118/78 mmHg  Pulse 76  Temp(Src) 98.7 F (37.1 C)  Resp 18  Ht 5\' 11"  (1.803 m)  Wt 239 lb (108.41  kg)  BMI 33.35 kg/m2  SpO2 98% Physical Exam  Constitutional: He is oriented to person, place, and time. He appears well-developed and well-nourished. No distress.  HENT:  Head: Normocephalic and atraumatic.  Eyes: Pupils are equal, round, and reactive to light.  Neck: Normal range of motion.  Cardiovascular: Normal rate and regular rhythm.   Pulmonary/Chest: Effort normal. No respiratory distress.  Musculoskeletal: Normal range of motion.  Deformity R knee obv with marked medial deviation Prev surg scars bilat  Neurological: He is alert and oriented to person, place, and time.  Skin: Skin is warm and dry.  Psychiatric: He has a normal mood and affect. His behavior is normal.  Nursing note and vitals reviewed.     Assessment & Plan:  Chronic pain syndrome due to Internal derangement of right knee/chr dz L knee Awaiting eval at [facility] when insured  Elevated BP--now wnl  Meds ordered this encounter  Medications  . oxyCODONE (ROXICODONE) 15 MG immediate release tablet    Sig: Take 1 tablet (15 mg total) by mouth every 4 (four) hours as needed for pain. One month supply-for at least 30 d after signed and 30d after last rx filled    Dispense:  120 tablet    Refill:  0  . oxyCODONE (ROXICODONE) 15 MG immediate release tablet    Sig: Take 1  tablet (15 mg total) by mouth every 4 (four) hours as needed for pain.    Dispense:  120 tablet    Refill:  0  . morphine (MS CONTIN) 15 MG 12 hr tablet    Sig: Take 1 tablet (15 mg total) by mouth every 12 (twelve) hours. For at least 30d after signed or after last rx filled    Dispense:  60 tablet    Refill:  0  . morphine (MS CONTIN) 15 MG 12 hr tablet    Sig: Take 1 tablet (15 mg total) by mouth every 12 (twelve) hours.    Dispense:  60 tablet    Refill:  0   F/u 2mos     I have completed the patient encounter in its entirety as documented by the scribe, with editing by me where necessary. Nikolis Berent P. Merla Riches, M.D.

## 2015-06-05 ENCOUNTER — Ambulatory Visit (INDEPENDENT_AMBULATORY_CARE_PROVIDER_SITE_OTHER): Payer: Self-pay | Admitting: Internal Medicine

## 2015-06-05 VITALS — BP 124/80 | HR 102 | Temp 98.0°F | Resp 18 | Ht 71.0 in | Wt 235.0 lb

## 2015-06-05 DIAGNOSIS — F1120 Opioid dependence, uncomplicated: Secondary | ICD-10-CM

## 2015-06-05 DIAGNOSIS — M2391 Unspecified internal derangement of right knee: Secondary | ICD-10-CM

## 2015-06-05 DIAGNOSIS — G894 Chronic pain syndrome: Secondary | ICD-10-CM

## 2015-06-05 MED ORDER — OXYCODONE HCL 15 MG PO TABS: 15.0000 mg | ORAL_TABLET | ORAL | Status: DC | PRN

## 2015-06-05 MED ORDER — MORPHINE SULFATE ER 15 MG PO TBCR: 15.0000 mg | EXTENDED_RELEASE_TABLET | Freq: Two times a day (BID) | ORAL | Status: DC

## 2015-06-05 NOTE — Progress Notes (Addendum)
Subjective:  This chart was scribed for Arthur Sia, MD by [name], ED Scribe. This patient was seen in room 13 and the patient's care was started at 4:47 PM.   Patient ID: Arthur Alvarez, male    DOB: [birth year], 44 y.o.   MRN: [id number]  HPI  Chief Complaint  Patient presents with  . Medication Refill    morphine, oxycondone  . Edema    foot and legs has standing job     HPI Comments: Arthur Alvarez is a 44 y.o. male who presents to the [facility] leg swelling as well as for a medication refill. 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 Pt enjoys his new job so far   Awaiting insurance 90 days for f/u at [facility] for knee surgery that may allow him to get off opiates  Past Medical History  Diagnosis Date  . Arthritis   . Neuromuscular disorder (HCC)    Allergies  Allergen Reactions  . Penicillins   . Sulfa Antibiotics    Prior to Admission medications   Medication Sig Start Date End Date Taking? Authorizing Provider  morphine (MS CONTIN) 15 MG 12 hr tablet Take 1 tablet (15 mg total) by mouth every 12 (twelve) hours. For at least 30d after signed or after last rx filled 04/04/15  Yes Tonye Pearson, MD  oxyCODONE (ROXICODONE) 15 MG immediate release tablet Take 1 tablet (15 mg total) by mouth every 4 (four) hours as needed for pain. One month supply-for at least 30 d after signed and 30d after last rx filled 04/04/15  Yes Tonye Pearson, MD  morphine (MS CONTIN) 15 MG 12 hr tablet Take 1 tablet (15 mg total) by mouth every 12 (twelve) hours. 04/04/15   Tonye Pearson, MD  oxyCODONE (ROXICODONE) 15 MG immediate release tablet Take 1 tablet (15 mg total) by mouth every 4 (four) hours as needed for pain. 04/04/15   Tonye Pearson, MD   Review of Systems     Objective:   Physical Exam  As in past--R Knee Also with  puffy ankles but no pitting, good pulses, no sens losses Filed Vitals:   06/05/15 1549  BP: 124/80  Pulse: 102  Temp: 98 F (36.7 C)  TempSrc: Oral  Resp: 18  Height: 5\' 11"  (1.803 m)  Weight: 235 lb (106.595 kg)  SpO2: 98%    Assessment & Plan:  Internal derangement of right knee  Opioid type dependence, continuous (HCC)  Chronic pain syndrome  Needs compr stock at work Meds ordered this encounter  Medications  . oxyCODONE (ROXICODONE) 15 MG immediate release tablet    Sig: Take 1 tablet (15 mg total) by mouth every 4 (four) hours as needed for pain.    Dispense:  120 tablet    Refill:  0  . oxyCODONE (ROXICODONE) 15 MG immediate release tablet    Sig: Take 1 tablet (15 mg total) by mouth every 4 (four) hours as needed for pain. One month supply-for at least 30 d after signed and 30d after last rx filled    Dispense:  120 tablet    Refill:  0  . morphine (MS CONTIN) 15 MG 12 hr tablet    Sig: Take 1 tablet (15 mg total) by mouth every 12 (twelve) hours.    Dispense:  60 tablet  Refill:  0  . morphine (MS CONTIN) 15 MG 12 hr tablet    Sig: Take 1 tablet (15 mg total) by mouth every 12 (twelve) hours. For at least 30d after signed or after last rx filled    Dispense:  60 tablet    Refill:  0  f/u 2 mos  I have completed the patient encounter in its entirety as documented by the scribe, with editing by me where necessary. Cailin Gebel P. Merla Riches, M.D.

## 2015-07-30 ENCOUNTER — Ambulatory Visit (INDEPENDENT_AMBULATORY_CARE_PROVIDER_SITE_OTHER): Payer: Self-pay | Admitting: Internal Medicine

## 2015-07-30 VITALS — BP 148/84 | HR 85 | Temp 98.8°F | Resp 16 | Ht 70.0 in | Wt 220.0 lb

## 2015-07-30 DIAGNOSIS — R030 Elevated blood-pressure reading, without diagnosis of hypertension: Secondary | ICD-10-CM

## 2015-07-30 DIAGNOSIS — G894 Chronic pain syndrome: Secondary | ICD-10-CM

## 2015-07-30 DIAGNOSIS — IMO0001 Reserved for inherently not codable concepts without codable children: Secondary | ICD-10-CM

## 2015-07-30 DIAGNOSIS — M2391 Unspecified internal derangement of right knee: Secondary | ICD-10-CM

## 2015-07-30 MED ORDER — MORPHINE SULFATE ER 15 MG PO TBCR: 15.0000 mg | EXTENDED_RELEASE_TABLET | Freq: Two times a day (BID) | ORAL | Status: DC

## 2015-07-30 MED ORDER — OXYCODONE HCL 15 MG PO TABS: 15.0000 mg | ORAL_TABLET | ORAL | Status: DC | PRN

## 2015-07-30 NOTE — Progress Notes (Signed)
Subjective:  This chart was scribed for [name], MD by Stann Ore, Medical Scribe. This patient was seen in Room 11 and the patient's care was started at 1:22 PM.    Patient ID: Arthur Alvarez, male    DOB: July 03, 1971, 44 y.o.   MRN: [id number] Chief Complaint  Patient presents with  . Medication Refill    Morphine & Oxycodone    HPI Arthur Alvarez is a 44 y.o. male who has chronic pain syndrome presents to [facility] for medication refill on morphine and oxycodone. He notices more pain recently due to cold weather and work.   His last knee surgery was done at [facility]. He would previously have knee surgeries every 4 years.   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   Patient Active Problem List   Diagnosis Date Noted  . BMI 34.0-34.9,adult 12/27/2014  . Elevated BP 12/27/2014  . Gonalgia 11/14/2014  . Internal derangement of right knee 10/04/2014  . Chronic pain syndrome 05/17/2014  . Opioid type dependence, continuous (HCC) 05/17/2014    Current outpatient prescriptions:  .  morphine (MS CONTIN) 15 MG 12 hr tablet, Take 1 tablet (15 mg total) by mouth every 12 (twelve) hours., Disp: 60 tablet, Rfl: 0 .  oxyCODONE (ROXICODONE) 15 MG immediate release tablet, Take 1 tablet (15 mg total) by mouth every 4 (four) hours as needed for pain., Disp: 120 tablet, Rfl: 0  Review of Systems  Constitutional: Negative for fever, chills, appetite change and fatigue.  Respiratory: Negative for cough.   Cardiovascular: Negative for leg swelling.  Musculoskeletal: Positive for arthralgias. Negative for gait problem.  Skin: Negative for rash and wound.  Neurological: Negative for dizziness, weakness, numbness and headaches.       Objective:   Physical Exam  Constitutional: He is oriented to person, place, and time. He appears well-developed and well-nourished. No distress.  HENT:  Head: Normocephalic and atraumatic.  Eyes:  EOM are normal. Pupils are equal, round, and reactive to light.  Neck: Neck supple.  Cardiovascular: Normal rate.   Pulmonary/Chest: Effort normal. No respiratory distress.  Musculoskeletal: Normal range of motion.  Neurological: He is alert and oriented to person, place, and time.  Skin: Skin is warm and dry.  Psychiatric: He has a normal mood and affect. His behavior is normal.  Nursing note and vitals reviewed.   BP 148/84 mmHg  Pulse 85  Temp(Src) 98.8 F (37.1 C) (Oral)  Resp 16  Ht 5\' 10"  (1.778 m)  Wt 220 lb (99.791 kg)  BMI 31.57 kg/m2  SpO2 99%     Assessment & Plan:  Chronic pain syndrome due to Internal derangement of right knee s/p multiple surgeries -will see ortho at [facility] when insured(Jan '17) -allow extra dose this month if needed due to response to cold at work  Elevated BP-to fu for eval/cpe in jan/feb '17  Meds ordered this encounter  Medications  . oxyCODONE (ROXICODONE) 15 MG immediate release tablet    Sig: Take 1 tablet (15 mg total) by mouth every 4 (four) hours as needed for pain. One month supply-for at least 30 d after signed and 30d after last rx filled    Dispense:  120 tablet    Refill:  0  . oxyCODONE (ROXICODONE) 15 MG immediate release tablet    Sig: Take 1 tablet (15 mg total) by mouth every 4 (four) hours as needed for pain. May add one extra dose  midday if needed.    Dispense:  150 tablet    Refill:  0  . morphine (MS CONTIN) 15 MG 12 hr tablet    Sig: Take 1 tablet (15 mg total) by mouth every 12 (twelve) hours. For at least 30d after signed or after last rx filled    Dispense:  60 tablet    Refill:  0  . morphine (MS CONTIN) 15 MG 12 hr tablet    Sig: Take 1 tablet (15 mg total) by mouth every 12 (twelve) hours.    Dispense:  60 tablet    Refill:  0      By signing my name below, I, Stann Ore, attest that this documentation has been prepared under the direction and in the presence of [name], MD. Electronically  Signed: Stann Ore, Scribe. 07/30/2015 , 1:22 PM .  I have completed the patient encounter in its entirety as documented by the scribe, with editing by me where necessary. Romuald Mccaslin P. Merla Riches, M.D.

## 2015-09-16 ENCOUNTER — Ambulatory Visit (INDEPENDENT_AMBULATORY_CARE_PROVIDER_SITE_OTHER): Payer: BLUE CROSS/BLUE SHIELD | Admitting: Internal Medicine

## 2015-09-16 VITALS — BP 118/72 | HR 100 | Temp 98.7°F | Resp 16 | Ht 71.0 in | Wt 227.0 lb

## 2015-09-16 DIAGNOSIS — G894 Chronic pain syndrome: Secondary | ICD-10-CM | POA: Diagnosis not present

## 2015-09-16 DIAGNOSIS — Z0000 Encounter for general adult medical examination without abnormal findings: Secondary | ICD-10-CM

## 2015-09-16 DIAGNOSIS — F1120 Opioid dependence, uncomplicated: Secondary | ICD-10-CM | POA: Diagnosis not present

## 2015-09-16 DIAGNOSIS — M2391 Unspecified internal derangement of right knee: Secondary | ICD-10-CM | POA: Diagnosis not present

## 2015-09-16 MED ORDER — OXYCODONE HCL 15 MG PO TABS: ORAL_TABLET | ORAL | Status: DC

## 2015-09-16 MED ORDER — MORPHINE SULFATE ER 15 MG PO TBCR: 15.0000 mg | EXTENDED_RELEASE_TABLET | Freq: Two times a day (BID) | ORAL | Status: DC

## 2015-09-16 NOTE — Progress Notes (Signed)
Subjective:  This chart was scribed for [name], MD by [facility], medical scribe at [facility].The patient was seen in exam room 11 and the patient's care was started at 3:43 PM.   Patient ID: Arthur Alvarez, male    DOB: 05/29/1971, 45 y.o.   MRN: [id number] Chief Complaint  Patient presents with  . Medication Refill    morphine and oxycodone   HPI HPI Comments: Arthur Alvarez is a 45 y.o. male who presents to [facility] for a medication refill. He needs his morphine and oxycodone refilled.   Patient Active Problem List   Diagnosis Date Noted  . BMI 34.0-34.9,adult 12/27/2014  . Elevated BP 12/27/2014  . Internal derangement of right knee 10/04/2014  . Chronic pain syndrome 05/17/2014  . 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 Arthritis   . Neuromuscular disorder (HCC)    Prior to Admission medications   Medication  morphine (MS CONTIN) 15 MG 12 hr tablet  oxyCODONE (ROXICODONE) 15 MG immediate release tablet   Allergies  Allergen Reactions  . Penicillins   . Sulfa Antibiotics    Review of Systems  Musculoskeletal: Positive for arthralgias.       Objective:  BP 118/72 mmHg  Pulse 100  Temp(Src) 98.7 F (37.1 C) (Oral)  Resp 16  Ht 5\' 11"  (1.803 m)  Wt 227 lb (102.967 kg)  BMI 31.67 kg/m2  SpO2 94% Physical Exam  Constitutional: He is oriented to person, place, and time. He appears well-developed and well-nourished. No distress.  HENT:  Head: Normocephalic and atraumatic.  Eyes: Pupils are equal, round, and reactive to light.  Neck: Normal range of motion.  Cardiovascular: Normal rate and regular rhythm.     Pulmonary/Chest: Effort normal. No respiratory distress.  Neurological: He is alert and oriented to person, place, and time. No cranial nerve deficit.  Skin: Skin is warm and dry.  Psychiatric: He has a normal mood and affect. His behavior is normal.  Nursing note and vitals reviewed.      Assessment & Plan:  Chronic pain syndrome  Internal derangement of right knee  Opioid type dependence, continuous (HCC)  He began care with [place] in February 2016 after a discharge from [facility] pain management. Office note February 2016 details his problems. It has been too many years since he has had an adequate orthopedic evaluation and I would like a consultation with Drs Levonne Lapping or Trilby Leaver at [facility] to determine whether he has any possible surgical options that might allow him to get off pain medication, or whether he needs a new chronic pain management facility in [place]. My history suggests that he was relegated to chronic pain therapy many years ago before his options were exhausted for correction. His medicines will be refilled for the next 3 months to allow this transition  Meds ordered this encounter--- his new insurance allows 90 day prescription   . oxyCODONE (ROXICODONE) 15 MG immediate release tablet    Sig: 1 tab every 4-6 h as needed up to 5 times a day. 3 month supply to follow last filled prescription    Dispense:  450 tablet    Refill:  0  . morphine (MS CONTIN) 15 MG 12 hr tablet    Sig: Take 1 tablet (15 mg total) by mouth every 12 (twelve) hours. 3 month supply to follow last prescrption    Dispense:  180 tablet    Refill:  0  I will also try to arrange primary care by referral in [place] at his request : Avel Sensor., MD Family Medicine [facility] [phone]  I have completed the patient encounter in its entirety as documented by the scribe, with editing by me where necessary. Yasmin Bronaugh P. Merla Riches, M.D.  By signing my name  below, I, Nadim Abuhashem, attest that this documentation has been prepared under the direction and in the presence of [name], MD.  Electronically Signed: Conchita Paris, medical scribe. 09/16/2015, 3:45 PM.

## 2015-11-24 ENCOUNTER — Ambulatory Visit (INDEPENDENT_AMBULATORY_CARE_PROVIDER_SITE_OTHER): Payer: BLUE CROSS/BLUE SHIELD | Admitting: Internal Medicine

## 2015-11-24 VITALS — BP 154/84 | HR 120 | Temp 98.7°F | Ht 70.0 in | Wt 235.0 lb

## 2015-11-24 DIAGNOSIS — G894 Chronic pain syndrome: Secondary | ICD-10-CM

## 2015-11-24 DIAGNOSIS — IMO0001 Reserved for inherently not codable concepts without codable children: Secondary | ICD-10-CM

## 2015-11-24 DIAGNOSIS — Z6834 Body mass index (BMI) 34.0-34.9, adult: Secondary | ICD-10-CM

## 2015-11-24 DIAGNOSIS — M2391 Unspecified internal derangement of right knee: Secondary | ICD-10-CM

## 2015-11-24 DIAGNOSIS — R030 Elevated blood-pressure reading, without diagnosis of hypertension: Secondary | ICD-10-CM | POA: Diagnosis not present

## 2015-11-24 MED ORDER — MORPHINE SULFATE ER 15 MG PO TBCR: 15.0000 mg | EXTENDED_RELEASE_TABLET | Freq: Two times a day (BID) | ORAL | Status: DC

## 2015-11-24 MED ORDER — OXYCODONE HCL 15 MG PO TABS: ORAL_TABLET | ORAL | Status: DC

## 2015-11-24 NOTE — Progress Notes (Signed)
Subjective:  By signing my name below, I, Arthur Alvarez, attest that this documentation has been prepared under the direction and in the presence of Arthur Sia, MD.  Electronically Signed: Andrew Alvarez, ED Scribe. 11/24/2015. 6:15 PM.   Patient ID: Arthur Alvarez, male    DOB: 14-Jan-1971, 45 y.o.   MRN: [id number]  HPI Chief Complaint  Patient presents with  . Medication Refill    oxycodone & MS Contin  . Elevated blood pressure at triage    HPI Comments: Arthur Alvarez is a 45 y.o. male who presents to the [facility] for a medication refill of oxycodone and MS contin.  Pt was seen  10/24/15 by Dr Arthur Alvarez at [facility]- orthopedic surgeon for right knee. He states he is soon to be fitted for a brace and will possibly need surgical correction, but hopes to put this off as long as possible: ((((((((Assessment -  1. Post-traumatic osteoarthritis of right knee   Plan -  Arthur Alvarez presents today for evaluation of his right knee pain. He has a significant valgus deformity from an old football injury, as well as two screws that are retained. He denies any previous history of infections. He states that the pain medicine does help his pain and he is not at the point yet where his pain is limiting his quality of life to the point where he wants to have surgery. I discussed what the surgery would entail. He is interested in conservative options at this point in time, and I do feel this is reasonable, given his young age. We will provide him a custom lateral unloader brace. I discussed my concerns of his MCL laxity and that this could worsen over time. I will plan to Alvarez him back in six months to reassess his progress and discuss further treatment options. We did agree that at some point in time, surgery would be beneficial for him and that if his pain comes to the point where he is unable to perform his daily activities, this is certainly a reasonable time to do this, despite his young  age. He was provided our phone number today as well.))))))))   He was referred for primary care to Arthur Alvarez 2/17 but states he has not heard from them.   His job is doing well.   Patient Active Problem List   Diagnosis Date Noted  . BMI 34.0-34.9,adult 12/27/2014  . Elevated BP 12/27/2014  . Internal derangement of right knee 10/04/2014  . Chronic pain syndrome 05/17/2014  . Opioid type dependence, continuous (HCC) 05/17/2014   Past Medical History  Diagnosis Date  . Arthritis   . Neuromuscular disorder [facility])    Past Surgical History  Procedure Laterality Date  . Joint replacement    . Spine surgery     Allergies  Allergen Reactions  . Penicillins   . Sulfa Antibiotics    Prior to Admission medications   Medication Sig Start Date End Date Taking? Authorizing Provider  morphine (MS CONTIN) 15 MG 12 hr tablet Take 1 tablet (15 mg total) by mouth every 12 (twelve) hours. 3 month supply to follow last prescrption 09/16/15  Yes Arthur Pearson, MD  oxyCODONE (ROXICODONE) 15 MG immediate release tablet 1 tab every 4-6 h as needed up to 5 times a day. 3 month supply to follow last filled prescription 09/16/15  Yes Arthur Pearson, MD    Review of Systems He has no current symptoms other than allergies outside  of present illness    Objective:   Physical Exam  Constitutional: He is oriented to person, place, and time. He appears well-developed and well-nourished. No distress.  HENT:  Head: Normocephalic and atraumatic.  Eyes: Conjunctivae and EOM are normal.  Neck: Neck supple.  Musculoskeletal:  He has the obvious valgus deformity secondary to disabled knee joint on the right  Neurological: He is alert and oriented to person, place, and time.  Skin: Skin is dry.  Psychiatric: He has a normal mood and affect. His behavior is normal.  Nursing note and vitals reviewed.   Filed Vitals:   11/24/15 1750 11/24/15 1753  BP: 148/78 154/84  Pulse: 120   Temp: 98.7 F  (37.1 C)   TempSrc: Oral   Height: 5\' 10"  (1.778 m)   Weight: 235 lb (106.595 kg)   SpO2: 98%      Assessment & Plan:  Chronic pain syndrome Internal derangement of right knee --- Now that he has been evaluated at [facility] and there is a plan for surgery at some point in a brace for current stability, he can be referred to chronic pain management which we will try to set up in [facility]  BMI 34.0-34.9,adult Elevated BP---His anxiousness is playing a role here --- He is being referred for primary care in [place] where he now lives    Meds ordered this encounter  Medications  . morphine (MS CONTIN) 15 MG 12 hr tablet    Sig: Take 1 tablet (15 mg total) by mouth every 12 (twelve) hours. 3 month supply to follow last prescrption    Dispense:  180 tablet    Refill:  0  . oxyCODONE (ROXICODONE) 15 MG immediate release tablet    Sig: 1 tab every 4-6 h as needed up to 5 times a day. 3 month supply to follow last filled prescription    Dispense:  450 tablet    Refill:  0    I have completed the patient encounter in its entirety as documented by the scribe, with editing by me where necessary. Arthur Alvarez P. Merla Riches, M.D.

## 2015-11-24 NOTE — Patient Instructions (Signed)
     IF you received an x-ray today, you will receive an invoice from [facility]. Please contact [facility] at [phone] with questions or concerns regarding your invoice.   IF you received labwork today, you will receive an invoice from [facility]. Please contact [facility] at [phone] with questions or concerns regarding your invoice.   Our billing staff will not be able to assist you with questions regarding bills from these companies.  You will be contacted with the lab results as soon as they are available. The fastest way to get your results is to activate your My Chart account. Instructions are located on the last page of this paperwork. If you have not heard from us regarding the results in 2 [place], please contact this office.      

## 2015-12-10 ENCOUNTER — Telehealth: Payer: Self-pay

## 2015-12-10 NOTE — Telephone Encounter (Signed)
The patient is requesting a work note from stating that he takes oxycodone and morphine as prescribed by Dr Merla Richesoolittle to manage his chronic pain.  His employer was bought out by another company recently, and he would like to cover himself with this new company.  Patient would like a call once it is ready for pickup.  CB#: [phone]

## 2015-12-12 NOTE — Telephone Encounter (Signed)
Can we do this ?

## 2015-12-14 NOTE — Telephone Encounter (Signed)
Of to affirm his prescriptions with a letter  Current outpatient prescriptions:  .  morphine (MS CONTIN) 15 MG 12 hr tablet, Take 1 tablet (15 mg total) by mouth every 12 (twelve) hours. 3 month supply to follow last prescrption, Disp: 180 tablet, Rfl: 0 .  oxyCODONE (ROXICODONE) 15 MG immediate release tablet, 1 tab every 4-6 h as needed up to 5 times a day. 3 month supply to follow last filled prescription, Disp: 450 tablet, Rfl: 0

## 2015-12-18 NOTE — Telephone Encounter (Signed)
Note ready

## 2015-12-18 NOTE — Telephone Encounter (Signed)
Pt advised.

## 2016-02-19 ENCOUNTER — Ambulatory Visit (INDEPENDENT_AMBULATORY_CARE_PROVIDER_SITE_OTHER): Payer: BLUE CROSS/BLUE SHIELD | Admitting: Internal Medicine

## 2016-02-19 VITALS — BP 148/88 | HR 100 | Temp 98.1°F | Resp 18 | Ht 70.0 in | Wt 248.2 lb

## 2016-02-19 DIAGNOSIS — G894 Chronic pain syndrome: Secondary | ICD-10-CM | POA: Diagnosis not present

## 2016-02-19 DIAGNOSIS — M2391 Unspecified internal derangement of right knee: Secondary | ICD-10-CM | POA: Diagnosis not present

## 2016-02-19 MED ORDER — OXYCODONE HCL 15 MG PO TABS: ORAL_TABLET | ORAL | Status: DC

## 2016-02-19 MED ORDER — MORPHINE SULFATE ER 15 MG PO TBCR: 15.0000 mg | EXTENDED_RELEASE_TABLET | Freq: Two times a day (BID) | ORAL | Status: AC

## 2016-02-19 MED ORDER — MORPHINE SULFATE ER 15 MG PO TBCR: 15.0000 mg | EXTENDED_RELEASE_TABLET | Freq: Two times a day (BID) | ORAL | Status: DC

## 2016-02-19 MED ORDER — OXYCODONE HCL 15 MG PO TABS: ORAL_TABLET | ORAL | Status: AC

## 2016-02-19 NOTE — Progress Notes (Signed)
Chief Complaint  Patient presents with  . Medication Refill    Morphine, oxycodone   [facility] shows no other providers x me and no illegal use  Patient Active Problem List   Diagnosis Date Noted  . BMI 34.0-34.9,adult 12/27/2014  . Elevated BP 12/27/2014  . Internal derangement of right knee 10/04/2014  . Chronic pain syndrome 05/17/2014  . Opioid type dependence, continuous (HCC) 05/17/2014   Has not established PCP yet See ortho consult [facility]--he continues to be able to work with brace and meds and is reluctant to try surgery Never heard from pain man referral  As for PCP: This ref made in jan 17--? results Avel SensorMoretz, Christian B., MD    Family Medicine   [facility]   [phone]   Moving to [place] and needs new PCP in next 3 months   Exam-unchanged  Imp Chronic pain syndrome - Plan: Ambulatory referral to Pain Clinic  Internal derangement of right knee - Plan: Ambulatory referral to Pain Clinic  Meds ordered this encounter  Medications  . morphine (MS CONTIN) 15 MG 12 hr tablet    Sig: Take 1 tablet (15 mg total) by mouth every 12 (twelve) hours. 3 month supply to follow last prescrption    Dispense:  180 tablet    Refill:  0  . oxyCODONE (ROXICODONE) 15 MG immediate release tablet    Sig: 1 tab every 4-6 h as needed up to 5 times a day. 3 month supply to follow last filled prescription    Dispense:  450 tablet    Refill:  0   Referrals made Needs f/u BP and weight

## 2016-02-19 NOTE — Patient Instructions (Addendum)
     IF you received an x-ray today, you will receive an invoice from [facility]. Please contact [facility] at [phone] with questions or concerns regarding your invoice.   IF you received labwork today, you will receive an invoice from [facility]. Please contact [facility] at [phone] with questions or concerns regarding your invoice.   Our billing staff will not be able to assist you with questions regarding bills from these companies.  You will be contacted with the lab results as soon as they are available. The fastest way to get your results is to activate your My Chart account. Instructions are located on the last page of this paperwork. If you have not heard from us regarding the results in [place], please contact this office.    Avel SensorMoretz, Christian B., MD    Family Medicine   [facility]   [phone]   Moving to [place] and needs new PCP in next 3 months

## 2024-03-05 ENCOUNTER — Encounter: Payer: Self-pay | Admitting: Advanced Practice Midwife
